# Patient Record
Sex: Male | Born: 1935 | Race: White | Hispanic: No | Marital: Married | Smoking: Former smoker
Health system: Southern US, Community
[De-identification: ages and names within clinical notes are randomized; demographics above are authoritative.]

## PROBLEM LIST (undated history)

## (undated) DIAGNOSIS — J111 Influenza due to unidentified influenza virus with other respiratory manifestations: Secondary | ICD-10-CM

## (undated) DIAGNOSIS — J61 Pneumoconiosis due to asbestos and other mineral fibers: Secondary | ICD-10-CM

## (undated) DIAGNOSIS — F0390 Unspecified dementia without behavioral disturbance: Secondary | ICD-10-CM

## (undated) HISTORY — DX: Pneumoconiosis due to asbestos and other mineral fibers: J61

## (undated) HISTORY — PX: HERNIA REPAIR: SHX51

## (undated) HISTORY — DX: Influenza due to unidentified influenza virus with other respiratory manifestations: J11.1

## (undated) HISTORY — PX: KIDNEY STONE SURGERY: SHX686

---

## 1997-07-24 DIAGNOSIS — J111 Influenza due to unidentified influenza virus with other respiratory manifestations: Secondary | ICD-10-CM

## 1997-07-24 HISTORY — DX: Influenza due to unidentified influenza virus with other respiratory manifestations: J11.1

## 1999-07-25 DIAGNOSIS — J61 Pneumoconiosis due to asbestos and other mineral fibers: Secondary | ICD-10-CM

## 1999-07-25 HISTORY — DX: Pneumoconiosis due to asbestos and other mineral fibers: J61

## 2006-01-02 ENCOUNTER — Ambulatory Visit (HOSPITAL_COMMUNITY): Admission: RE | Admit: 2006-01-02 | Discharge: 2006-01-02 | Payer: Self-pay | Admitting: Ophthalmology

## 2006-11-08 ENCOUNTER — Ambulatory Visit: Payer: Self-pay | Admitting: Internal Medicine

## 2012-03-21 DIAGNOSIS — J0100 Acute maxillary sinusitis, unspecified: Secondary | ICD-10-CM | POA: Diagnosis not present

## 2012-03-21 DIAGNOSIS — Z13220 Encounter for screening for lipoid disorders: Secondary | ICD-10-CM | POA: Diagnosis not present

## 2012-03-21 DIAGNOSIS — E559 Vitamin D deficiency, unspecified: Secondary | ICD-10-CM | POA: Diagnosis not present

## 2012-03-21 DIAGNOSIS — L570 Actinic keratosis: Secondary | ICD-10-CM | POA: Diagnosis not present

## 2012-12-24 DIAGNOSIS — J0100 Acute maxillary sinusitis, unspecified: Secondary | ICD-10-CM | POA: Diagnosis not present

## 2013-01-28 DIAGNOSIS — R5383 Other fatigue: Secondary | ICD-10-CM | POA: Diagnosis not present

## 2013-01-28 DIAGNOSIS — C44611 Basal cell carcinoma of skin of unspecified upper limb, including shoulder: Secondary | ICD-10-CM | POA: Diagnosis not present

## 2013-01-28 DIAGNOSIS — G3184 Mild cognitive impairment, so stated: Secondary | ICD-10-CM | POA: Diagnosis not present

## 2013-01-28 DIAGNOSIS — L570 Actinic keratosis: Secondary | ICD-10-CM | POA: Diagnosis not present

## 2013-01-28 DIAGNOSIS — L989 Disorder of the skin and subcutaneous tissue, unspecified: Secondary | ICD-10-CM | POA: Diagnosis not present

## 2013-01-28 DIAGNOSIS — J309 Allergic rhinitis, unspecified: Secondary | ICD-10-CM | POA: Diagnosis not present

## 2014-06-20 DIAGNOSIS — Z882 Allergy status to sulfonamides status: Secondary | ICD-10-CM | POA: Diagnosis not present

## 2014-06-20 DIAGNOSIS — N390 Urinary tract infection, site not specified: Secondary | ICD-10-CM | POA: Diagnosis not present

## 2014-06-20 DIAGNOSIS — Z87891 Personal history of nicotine dependence: Secondary | ICD-10-CM | POA: Diagnosis not present

## 2014-06-20 DIAGNOSIS — Z880 Allergy status to penicillin: Secondary | ICD-10-CM | POA: Diagnosis not present

## 2014-06-20 DIAGNOSIS — J302 Other seasonal allergic rhinitis: Secondary | ICD-10-CM | POA: Diagnosis not present

## 2014-06-20 DIAGNOSIS — R300 Dysuria: Secondary | ICD-10-CM | POA: Diagnosis not present

## 2014-06-25 DIAGNOSIS — J0100 Acute maxillary sinusitis, unspecified: Secondary | ICD-10-CM | POA: Diagnosis not present

## 2014-06-25 DIAGNOSIS — R32 Unspecified urinary incontinence: Secondary | ICD-10-CM | POA: Diagnosis not present

## 2014-06-25 DIAGNOSIS — N400 Enlarged prostate without lower urinary tract symptoms: Secondary | ICD-10-CM | POA: Diagnosis not present

## 2014-10-26 DIAGNOSIS — Z961 Presence of intraocular lens: Secondary | ICD-10-CM | POA: Diagnosis not present

## 2014-10-26 DIAGNOSIS — H26491 Other secondary cataract, right eye: Secondary | ICD-10-CM | POA: Diagnosis not present

## 2014-10-26 DIAGNOSIS — H35363 Drusen (degenerative) of macula, bilateral: Secondary | ICD-10-CM | POA: Diagnosis not present

## 2015-03-01 DIAGNOSIS — L03211 Cellulitis of face: Secondary | ICD-10-CM | POA: Diagnosis not present

## 2015-03-01 DIAGNOSIS — R5382 Chronic fatigue, unspecified: Secondary | ICD-10-CM | POA: Diagnosis not present

## 2015-03-01 DIAGNOSIS — E559 Vitamin D deficiency, unspecified: Secondary | ICD-10-CM | POA: Diagnosis not present

## 2015-05-31 DIAGNOSIS — K5641 Fecal impaction: Secondary | ICD-10-CM | POA: Diagnosis not present

## 2015-05-31 DIAGNOSIS — K5900 Constipation, unspecified: Secondary | ICD-10-CM | POA: Diagnosis not present

## 2015-05-31 DIAGNOSIS — Z87891 Personal history of nicotine dependence: Secondary | ICD-10-CM | POA: Diagnosis not present

## 2015-05-31 DIAGNOSIS — K5649 Other impaction of intestine: Secondary | ICD-10-CM | POA: Diagnosis not present

## 2015-07-20 DIAGNOSIS — I10 Essential (primary) hypertension: Secondary | ICD-10-CM | POA: Diagnosis not present

## 2015-07-20 DIAGNOSIS — J0101 Acute recurrent maxillary sinusitis: Secondary | ICD-10-CM | POA: Diagnosis not present

## 2015-11-18 DIAGNOSIS — R5383 Other fatigue: Secondary | ICD-10-CM | POA: Diagnosis not present

## 2015-11-18 DIAGNOSIS — Z0000 Encounter for general adult medical examination without abnormal findings: Secondary | ICD-10-CM | POA: Diagnosis not present

## 2015-11-18 DIAGNOSIS — L570 Actinic keratosis: Secondary | ICD-10-CM | POA: Diagnosis not present

## 2015-11-18 DIAGNOSIS — N400 Enlarged prostate without lower urinary tract symptoms: Secondary | ICD-10-CM | POA: Diagnosis not present

## 2015-11-18 DIAGNOSIS — F0280 Dementia in other diseases classified elsewhere without behavioral disturbance: Secondary | ICD-10-CM | POA: Diagnosis not present

## 2016-06-16 DIAGNOSIS — Z809 Family history of malignant neoplasm, unspecified: Secondary | ICD-10-CM | POA: Diagnosis not present

## 2016-06-16 DIAGNOSIS — Z8249 Family history of ischemic heart disease and other diseases of the circulatory system: Secondary | ICD-10-CM | POA: Diagnosis not present

## 2016-06-16 DIAGNOSIS — I5089 Other heart failure: Secondary | ICD-10-CM | POA: Diagnosis not present

## 2016-06-16 DIAGNOSIS — J9601 Acute respiratory failure with hypoxia: Secondary | ICD-10-CM | POA: Diagnosis not present

## 2016-06-16 DIAGNOSIS — E860 Dehydration: Secondary | ICD-10-CM | POA: Diagnosis not present

## 2016-06-16 DIAGNOSIS — F0390 Unspecified dementia without behavioral disturbance: Secondary | ICD-10-CM | POA: Diagnosis present

## 2016-06-16 DIAGNOSIS — Z886 Allergy status to analgesic agent status: Secondary | ICD-10-CM | POA: Diagnosis not present

## 2016-06-16 DIAGNOSIS — Z880 Allergy status to penicillin: Secondary | ICD-10-CM | POA: Diagnosis not present

## 2016-06-16 DIAGNOSIS — J181 Lobar pneumonia, unspecified organism: Secondary | ICD-10-CM | POA: Diagnosis not present

## 2016-06-16 DIAGNOSIS — R0989 Other specified symptoms and signs involving the circulatory and respiratory systems: Secondary | ICD-10-CM | POA: Diagnosis not present

## 2016-06-16 DIAGNOSIS — I5030 Unspecified diastolic (congestive) heart failure: Secondary | ICD-10-CM | POA: Diagnosis present

## 2016-06-16 DIAGNOSIS — R0902 Hypoxemia: Secondary | ICD-10-CM | POA: Diagnosis not present

## 2016-06-16 DIAGNOSIS — Z87891 Personal history of nicotine dependence: Secondary | ICD-10-CM | POA: Diagnosis not present

## 2016-06-16 DIAGNOSIS — J61 Pneumoconiosis due to asbestos and other mineral fibers: Secondary | ICD-10-CM | POA: Diagnosis not present

## 2016-06-16 DIAGNOSIS — Z806 Family history of leukemia: Secondary | ICD-10-CM | POA: Diagnosis not present

## 2016-06-16 DIAGNOSIS — J168 Pneumonia due to other specified infectious organisms: Secondary | ICD-10-CM | POA: Diagnosis not present

## 2016-06-16 DIAGNOSIS — J189 Pneumonia, unspecified organism: Secondary | ICD-10-CM | POA: Diagnosis not present

## 2016-06-20 DIAGNOSIS — J181 Lobar pneumonia, unspecified organism: Secondary | ICD-10-CM | POA: Diagnosis not present

## 2016-06-20 DIAGNOSIS — J61 Pneumoconiosis due to asbestos and other mineral fibers: Secondary | ICD-10-CM | POA: Diagnosis not present

## 2016-06-20 DIAGNOSIS — I5089 Other heart failure: Secondary | ICD-10-CM | POA: Diagnosis not present

## 2016-06-20 DIAGNOSIS — Z87891 Personal history of nicotine dependence: Secondary | ICD-10-CM | POA: Diagnosis not present

## 2016-06-20 DIAGNOSIS — J17 Pneumonia in diseases classified elsewhere: Secondary | ICD-10-CM | POA: Diagnosis not present

## 2016-06-20 DIAGNOSIS — I509 Heart failure, unspecified: Secondary | ICD-10-CM | POA: Diagnosis not present

## 2016-06-20 DIAGNOSIS — E860 Dehydration: Secondary | ICD-10-CM | POA: Diagnosis not present

## 2016-06-21 DIAGNOSIS — J61 Pneumoconiosis due to asbestos and other mineral fibers: Secondary | ICD-10-CM | POA: Diagnosis not present

## 2016-06-21 DIAGNOSIS — J181 Lobar pneumonia, unspecified organism: Secondary | ICD-10-CM | POA: Diagnosis not present

## 2016-06-21 DIAGNOSIS — I509 Heart failure, unspecified: Secondary | ICD-10-CM | POA: Diagnosis not present

## 2016-06-21 DIAGNOSIS — Z87891 Personal history of nicotine dependence: Secondary | ICD-10-CM | POA: Diagnosis not present

## 2016-06-21 DIAGNOSIS — E860 Dehydration: Secondary | ICD-10-CM | POA: Diagnosis not present

## 2016-06-22 DIAGNOSIS — Z87891 Personal history of nicotine dependence: Secondary | ICD-10-CM | POA: Diagnosis not present

## 2016-06-22 DIAGNOSIS — I509 Heart failure, unspecified: Secondary | ICD-10-CM | POA: Diagnosis not present

## 2016-06-22 DIAGNOSIS — J61 Pneumoconiosis due to asbestos and other mineral fibers: Secondary | ICD-10-CM | POA: Diagnosis not present

## 2016-06-22 DIAGNOSIS — E860 Dehydration: Secondary | ICD-10-CM | POA: Diagnosis not present

## 2016-06-22 DIAGNOSIS — J181 Lobar pneumonia, unspecified organism: Secondary | ICD-10-CM | POA: Diagnosis not present

## 2016-06-26 DIAGNOSIS — J168 Pneumonia due to other specified infectious organisms: Secondary | ICD-10-CM | POA: Diagnosis not present

## 2016-06-27 DIAGNOSIS — Z87891 Personal history of nicotine dependence: Secondary | ICD-10-CM | POA: Diagnosis not present

## 2016-06-27 DIAGNOSIS — I509 Heart failure, unspecified: Secondary | ICD-10-CM | POA: Diagnosis not present

## 2016-06-27 DIAGNOSIS — E860 Dehydration: Secondary | ICD-10-CM | POA: Diagnosis not present

## 2016-06-27 DIAGNOSIS — J61 Pneumoconiosis due to asbestos and other mineral fibers: Secondary | ICD-10-CM | POA: Diagnosis not present

## 2016-06-27 DIAGNOSIS — J181 Lobar pneumonia, unspecified organism: Secondary | ICD-10-CM | POA: Diagnosis not present

## 2016-06-29 DIAGNOSIS — J181 Lobar pneumonia, unspecified organism: Secondary | ICD-10-CM | POA: Diagnosis not present

## 2016-06-29 DIAGNOSIS — I509 Heart failure, unspecified: Secondary | ICD-10-CM | POA: Diagnosis not present

## 2016-06-29 DIAGNOSIS — E860 Dehydration: Secondary | ICD-10-CM | POA: Diagnosis not present

## 2016-06-29 DIAGNOSIS — Z87891 Personal history of nicotine dependence: Secondary | ICD-10-CM | POA: Diagnosis not present

## 2016-06-29 DIAGNOSIS — J61 Pneumoconiosis due to asbestos and other mineral fibers: Secondary | ICD-10-CM | POA: Diagnosis not present

## 2016-07-03 DIAGNOSIS — I509 Heart failure, unspecified: Secondary | ICD-10-CM | POA: Diagnosis not present

## 2016-07-03 DIAGNOSIS — J61 Pneumoconiosis due to asbestos and other mineral fibers: Secondary | ICD-10-CM | POA: Diagnosis not present

## 2016-07-03 DIAGNOSIS — Z87891 Personal history of nicotine dependence: Secondary | ICD-10-CM | POA: Diagnosis not present

## 2016-07-03 DIAGNOSIS — E860 Dehydration: Secondary | ICD-10-CM | POA: Diagnosis not present

## 2016-07-03 DIAGNOSIS — J181 Lobar pneumonia, unspecified organism: Secondary | ICD-10-CM | POA: Diagnosis not present

## 2016-07-04 DIAGNOSIS — I509 Heart failure, unspecified: Secondary | ICD-10-CM | POA: Diagnosis not present

## 2016-07-04 DIAGNOSIS — Z87891 Personal history of nicotine dependence: Secondary | ICD-10-CM | POA: Diagnosis not present

## 2016-07-04 DIAGNOSIS — E860 Dehydration: Secondary | ICD-10-CM | POA: Diagnosis not present

## 2016-07-04 DIAGNOSIS — J61 Pneumoconiosis due to asbestos and other mineral fibers: Secondary | ICD-10-CM | POA: Diagnosis not present

## 2016-07-04 DIAGNOSIS — J181 Lobar pneumonia, unspecified organism: Secondary | ICD-10-CM | POA: Diagnosis not present

## 2016-07-05 DIAGNOSIS — E860 Dehydration: Secondary | ICD-10-CM | POA: Diagnosis not present

## 2016-07-05 DIAGNOSIS — J181 Lobar pneumonia, unspecified organism: Secondary | ICD-10-CM | POA: Diagnosis not present

## 2016-07-05 DIAGNOSIS — J61 Pneumoconiosis due to asbestos and other mineral fibers: Secondary | ICD-10-CM | POA: Diagnosis not present

## 2016-07-05 DIAGNOSIS — I509 Heart failure, unspecified: Secondary | ICD-10-CM | POA: Diagnosis not present

## 2016-07-05 DIAGNOSIS — Z87891 Personal history of nicotine dependence: Secondary | ICD-10-CM | POA: Diagnosis not present

## 2016-07-07 DIAGNOSIS — J61 Pneumoconiosis due to asbestos and other mineral fibers: Secondary | ICD-10-CM | POA: Diagnosis not present

## 2016-07-07 DIAGNOSIS — J181 Lobar pneumonia, unspecified organism: Secondary | ICD-10-CM | POA: Diagnosis not present

## 2016-07-07 DIAGNOSIS — E860 Dehydration: Secondary | ICD-10-CM | POA: Diagnosis not present

## 2016-07-07 DIAGNOSIS — I509 Heart failure, unspecified: Secondary | ICD-10-CM | POA: Diagnosis not present

## 2016-07-07 DIAGNOSIS — Z87891 Personal history of nicotine dependence: Secondary | ICD-10-CM | POA: Diagnosis not present

## 2016-07-10 DIAGNOSIS — J61 Pneumoconiosis due to asbestos and other mineral fibers: Secondary | ICD-10-CM | POA: Diagnosis not present

## 2016-07-10 DIAGNOSIS — I509 Heart failure, unspecified: Secondary | ICD-10-CM | POA: Diagnosis not present

## 2016-07-10 DIAGNOSIS — E860 Dehydration: Secondary | ICD-10-CM | POA: Diagnosis not present

## 2016-07-10 DIAGNOSIS — J181 Lobar pneumonia, unspecified organism: Secondary | ICD-10-CM | POA: Diagnosis not present

## 2016-07-10 DIAGNOSIS — Z87891 Personal history of nicotine dependence: Secondary | ICD-10-CM | POA: Diagnosis not present

## 2016-07-13 DIAGNOSIS — Z87891 Personal history of nicotine dependence: Secondary | ICD-10-CM | POA: Diagnosis not present

## 2016-07-13 DIAGNOSIS — I509 Heart failure, unspecified: Secondary | ICD-10-CM | POA: Diagnosis not present

## 2016-07-13 DIAGNOSIS — J181 Lobar pneumonia, unspecified organism: Secondary | ICD-10-CM | POA: Diagnosis not present

## 2016-07-13 DIAGNOSIS — J61 Pneumoconiosis due to asbestos and other mineral fibers: Secondary | ICD-10-CM | POA: Diagnosis not present

## 2016-07-13 DIAGNOSIS — E860 Dehydration: Secondary | ICD-10-CM | POA: Diagnosis not present

## 2016-10-26 DIAGNOSIS — C44529 Squamous cell carcinoma of skin of other part of trunk: Secondary | ICD-10-CM | POA: Diagnosis not present

## 2016-10-26 DIAGNOSIS — C4442 Squamous cell carcinoma of skin of scalp and neck: Secondary | ICD-10-CM | POA: Diagnosis not present

## 2017-01-26 DIAGNOSIS — J3089 Other allergic rhinitis: Secondary | ICD-10-CM | POA: Diagnosis not present

## 2017-01-26 DIAGNOSIS — R630 Anorexia: Secondary | ICD-10-CM | POA: Diagnosis not present

## 2017-05-29 DIAGNOSIS — F0390 Unspecified dementia without behavioral disturbance: Secondary | ICD-10-CM | POA: Diagnosis present

## 2017-05-29 DIAGNOSIS — Z818 Family history of other mental and behavioral disorders: Secondary | ICD-10-CM | POA: Diagnosis not present

## 2017-05-29 DIAGNOSIS — W1839XA Other fall on same level, initial encounter: Secondary | ICD-10-CM | POA: Diagnosis not present

## 2017-05-29 DIAGNOSIS — M25562 Pain in left knee: Secondary | ICD-10-CM | POA: Diagnosis not present

## 2017-05-29 DIAGNOSIS — Z9181 History of falling: Secondary | ICD-10-CM | POA: Diagnosis not present

## 2017-05-29 DIAGNOSIS — M79605 Pain in left leg: Secondary | ICD-10-CM | POA: Diagnosis not present

## 2017-05-29 DIAGNOSIS — M79652 Pain in left thigh: Secondary | ICD-10-CM | POA: Diagnosis not present

## 2017-05-29 DIAGNOSIS — W182XXA Fall in (into) shower or empty bathtub, initial encounter: Secondary | ICD-10-CM | POA: Diagnosis not present

## 2017-05-29 DIAGNOSIS — M25552 Pain in left hip: Secondary | ICD-10-CM | POA: Diagnosis not present

## 2017-05-29 DIAGNOSIS — Z8249 Family history of ischemic heart disease and other diseases of the circulatory system: Secondary | ICD-10-CM | POA: Diagnosis not present

## 2017-05-29 DIAGNOSIS — S838X2A Sprain of other specified parts of left knee, initial encounter: Secondary | ICD-10-CM | POA: Diagnosis present

## 2017-05-29 DIAGNOSIS — Z884 Allergy status to anesthetic agent status: Secondary | ICD-10-CM | POA: Diagnosis not present

## 2017-05-29 DIAGNOSIS — Y93E1 Activity, personal bathing and showering: Secondary | ICD-10-CM | POA: Diagnosis not present

## 2017-05-29 DIAGNOSIS — Z880 Allergy status to penicillin: Secondary | ICD-10-CM | POA: Diagnosis not present

## 2017-05-29 DIAGNOSIS — R262 Difficulty in walking, not elsewhere classified: Secondary | ICD-10-CM | POA: Diagnosis not present

## 2017-05-29 DIAGNOSIS — Z806 Family history of leukemia: Secondary | ICD-10-CM | POA: Diagnosis not present

## 2017-05-29 DIAGNOSIS — Z809 Family history of malignant neoplasm, unspecified: Secondary | ICD-10-CM | POA: Diagnosis not present

## 2017-05-29 DIAGNOSIS — S72402A Unspecified fracture of lower end of left femur, initial encounter for closed fracture: Secondary | ICD-10-CM | POA: Diagnosis not present

## 2017-05-29 DIAGNOSIS — Z886 Allergy status to analgesic agent status: Secondary | ICD-10-CM | POA: Diagnosis not present

## 2017-05-29 DIAGNOSIS — Z888 Allergy status to other drugs, medicaments and biological substances status: Secondary | ICD-10-CM | POA: Diagnosis not present

## 2017-05-31 DIAGNOSIS — F0390 Unspecified dementia without behavioral disturbance: Secondary | ICD-10-CM | POA: Diagnosis not present

## 2017-05-31 DIAGNOSIS — Z9181 History of falling: Secondary | ICD-10-CM | POA: Diagnosis not present

## 2017-05-31 DIAGNOSIS — F0280 Dementia in other diseases classified elsewhere without behavioral disturbance: Secondary | ICD-10-CM | POA: Diagnosis not present

## 2017-05-31 DIAGNOSIS — S838X2D Sprain of other specified parts of left knee, subsequent encounter: Secondary | ICD-10-CM | POA: Diagnosis not present

## 2017-05-31 DIAGNOSIS — S8392XD Sprain of unspecified site of left knee, subsequent encounter: Secondary | ICD-10-CM | POA: Diagnosis not present

## 2017-06-01 DIAGNOSIS — M79605 Pain in left leg: Secondary | ICD-10-CM | POA: Diagnosis not present

## 2017-06-05 DIAGNOSIS — Z9181 History of falling: Secondary | ICD-10-CM | POA: Diagnosis not present

## 2017-06-05 DIAGNOSIS — F0390 Unspecified dementia without behavioral disturbance: Secondary | ICD-10-CM | POA: Diagnosis not present

## 2017-06-05 DIAGNOSIS — S8392XD Sprain of unspecified site of left knee, subsequent encounter: Secondary | ICD-10-CM | POA: Diagnosis not present

## 2017-06-06 DIAGNOSIS — Z9181 History of falling: Secondary | ICD-10-CM | POA: Diagnosis not present

## 2017-06-06 DIAGNOSIS — F0390 Unspecified dementia without behavioral disturbance: Secondary | ICD-10-CM | POA: Diagnosis not present

## 2017-06-06 DIAGNOSIS — S8392XD Sprain of unspecified site of left knee, subsequent encounter: Secondary | ICD-10-CM | POA: Diagnosis not present

## 2017-06-07 DIAGNOSIS — Z9181 History of falling: Secondary | ICD-10-CM | POA: Diagnosis not present

## 2017-06-07 DIAGNOSIS — S8392XD Sprain of unspecified site of left knee, subsequent encounter: Secondary | ICD-10-CM | POA: Diagnosis not present

## 2017-06-07 DIAGNOSIS — F0390 Unspecified dementia without behavioral disturbance: Secondary | ICD-10-CM | POA: Diagnosis not present

## 2017-06-08 DIAGNOSIS — S8392XD Sprain of unspecified site of left knee, subsequent encounter: Secondary | ICD-10-CM | POA: Diagnosis not present

## 2017-06-08 DIAGNOSIS — J302 Other seasonal allergic rhinitis: Secondary | ICD-10-CM | POA: Diagnosis not present

## 2017-06-08 DIAGNOSIS — Z9181 History of falling: Secondary | ICD-10-CM | POA: Diagnosis not present

## 2017-06-08 DIAGNOSIS — M25562 Pain in left knee: Secondary | ICD-10-CM | POA: Diagnosis not present

## 2017-06-08 DIAGNOSIS — M79652 Pain in left thigh: Secondary | ICD-10-CM | POA: Diagnosis not present

## 2017-06-08 DIAGNOSIS — F0280 Dementia in other diseases classified elsewhere without behavioral disturbance: Secondary | ICD-10-CM | POA: Diagnosis not present

## 2017-06-08 DIAGNOSIS — F0390 Unspecified dementia without behavioral disturbance: Secondary | ICD-10-CM | POA: Diagnosis not present

## 2017-06-11 DIAGNOSIS — S8392XD Sprain of unspecified site of left knee, subsequent encounter: Secondary | ICD-10-CM | POA: Diagnosis not present

## 2017-06-11 DIAGNOSIS — F0390 Unspecified dementia without behavioral disturbance: Secondary | ICD-10-CM | POA: Diagnosis not present

## 2017-06-11 DIAGNOSIS — Z9181 History of falling: Secondary | ICD-10-CM | POA: Diagnosis not present

## 2017-06-12 DIAGNOSIS — F0390 Unspecified dementia without behavioral disturbance: Secondary | ICD-10-CM | POA: Diagnosis not present

## 2017-06-12 DIAGNOSIS — S8392XD Sprain of unspecified site of left knee, subsequent encounter: Secondary | ICD-10-CM | POA: Diagnosis not present

## 2017-06-12 DIAGNOSIS — Z9181 History of falling: Secondary | ICD-10-CM | POA: Diagnosis not present

## 2017-06-19 DIAGNOSIS — Z9181 History of falling: Secondary | ICD-10-CM | POA: Diagnosis not present

## 2017-06-19 DIAGNOSIS — S8392XD Sprain of unspecified site of left knee, subsequent encounter: Secondary | ICD-10-CM | POA: Diagnosis not present

## 2017-06-19 DIAGNOSIS — F0390 Unspecified dementia without behavioral disturbance: Secondary | ICD-10-CM | POA: Diagnosis not present

## 2017-06-20 DIAGNOSIS — F0390 Unspecified dementia without behavioral disturbance: Secondary | ICD-10-CM | POA: Diagnosis not present

## 2017-06-20 DIAGNOSIS — S8392XD Sprain of unspecified site of left knee, subsequent encounter: Secondary | ICD-10-CM | POA: Diagnosis not present

## 2017-06-20 DIAGNOSIS — Z9181 History of falling: Secondary | ICD-10-CM | POA: Diagnosis not present

## 2017-06-21 DIAGNOSIS — Z9181 History of falling: Secondary | ICD-10-CM | POA: Diagnosis not present

## 2017-06-21 DIAGNOSIS — F0390 Unspecified dementia without behavioral disturbance: Secondary | ICD-10-CM | POA: Diagnosis not present

## 2017-06-21 DIAGNOSIS — S8392XD Sprain of unspecified site of left knee, subsequent encounter: Secondary | ICD-10-CM | POA: Diagnosis not present

## 2017-06-25 DIAGNOSIS — S8392XD Sprain of unspecified site of left knee, subsequent encounter: Secondary | ICD-10-CM | POA: Diagnosis not present

## 2017-06-25 DIAGNOSIS — F0390 Unspecified dementia without behavioral disturbance: Secondary | ICD-10-CM | POA: Diagnosis not present

## 2017-06-25 DIAGNOSIS — Z9181 History of falling: Secondary | ICD-10-CM | POA: Diagnosis not present

## 2017-06-29 DIAGNOSIS — Z9181 History of falling: Secondary | ICD-10-CM | POA: Diagnosis not present

## 2017-06-29 DIAGNOSIS — K5900 Constipation, unspecified: Secondary | ICD-10-CM | POA: Diagnosis not present

## 2017-06-29 DIAGNOSIS — R1084 Generalized abdominal pain: Secondary | ICD-10-CM | POA: Diagnosis not present

## 2017-06-29 DIAGNOSIS — N200 Calculus of kidney: Secondary | ICD-10-CM | POA: Diagnosis not present

## 2017-06-29 DIAGNOSIS — S8392XD Sprain of unspecified site of left knee, subsequent encounter: Secondary | ICD-10-CM | POA: Diagnosis not present

## 2017-06-29 DIAGNOSIS — F0390 Unspecified dementia without behavioral disturbance: Secondary | ICD-10-CM | POA: Diagnosis not present

## 2017-07-04 DIAGNOSIS — Z9181 History of falling: Secondary | ICD-10-CM | POA: Diagnosis not present

## 2017-07-04 DIAGNOSIS — S8392XD Sprain of unspecified site of left knee, subsequent encounter: Secondary | ICD-10-CM | POA: Diagnosis not present

## 2017-07-04 DIAGNOSIS — F0390 Unspecified dementia without behavioral disturbance: Secondary | ICD-10-CM | POA: Diagnosis not present

## 2017-07-05 DIAGNOSIS — S8392XD Sprain of unspecified site of left knee, subsequent encounter: Secondary | ICD-10-CM | POA: Diagnosis not present

## 2017-07-05 DIAGNOSIS — F0390 Unspecified dementia without behavioral disturbance: Secondary | ICD-10-CM | POA: Diagnosis not present

## 2017-07-05 DIAGNOSIS — Z9181 History of falling: Secondary | ICD-10-CM | POA: Diagnosis not present

## 2017-07-10 DIAGNOSIS — S8392XD Sprain of unspecified site of left knee, subsequent encounter: Secondary | ICD-10-CM | POA: Diagnosis not present

## 2017-07-10 DIAGNOSIS — F0390 Unspecified dementia without behavioral disturbance: Secondary | ICD-10-CM | POA: Diagnosis not present

## 2017-07-10 DIAGNOSIS — Z9181 History of falling: Secondary | ICD-10-CM | POA: Diagnosis not present

## 2017-07-12 DIAGNOSIS — Z9181 History of falling: Secondary | ICD-10-CM | POA: Diagnosis not present

## 2017-07-12 DIAGNOSIS — S8392XD Sprain of unspecified site of left knee, subsequent encounter: Secondary | ICD-10-CM | POA: Diagnosis not present

## 2017-07-12 DIAGNOSIS — F0390 Unspecified dementia without behavioral disturbance: Secondary | ICD-10-CM | POA: Diagnosis not present

## 2017-07-16 DIAGNOSIS — Z9181 History of falling: Secondary | ICD-10-CM | POA: Diagnosis not present

## 2017-07-16 DIAGNOSIS — F0390 Unspecified dementia without behavioral disturbance: Secondary | ICD-10-CM | POA: Diagnosis not present

## 2017-07-16 DIAGNOSIS — S8392XD Sprain of unspecified site of left knee, subsequent encounter: Secondary | ICD-10-CM | POA: Diagnosis not present

## 2017-07-18 DIAGNOSIS — F0390 Unspecified dementia without behavioral disturbance: Secondary | ICD-10-CM | POA: Diagnosis not present

## 2017-07-18 DIAGNOSIS — Z9181 History of falling: Secondary | ICD-10-CM | POA: Diagnosis not present

## 2017-07-18 DIAGNOSIS — S8392XD Sprain of unspecified site of left knee, subsequent encounter: Secondary | ICD-10-CM | POA: Diagnosis not present

## 2017-07-19 DIAGNOSIS — F0390 Unspecified dementia without behavioral disturbance: Secondary | ICD-10-CM | POA: Diagnosis not present

## 2017-07-19 DIAGNOSIS — Z9181 History of falling: Secondary | ICD-10-CM | POA: Diagnosis not present

## 2017-07-19 DIAGNOSIS — S8392XD Sprain of unspecified site of left knee, subsequent encounter: Secondary | ICD-10-CM | POA: Diagnosis not present

## 2017-07-24 DIAGNOSIS — S8392XD Sprain of unspecified site of left knee, subsequent encounter: Secondary | ICD-10-CM | POA: Diagnosis not present

## 2017-07-24 DIAGNOSIS — Z9181 History of falling: Secondary | ICD-10-CM | POA: Diagnosis not present

## 2017-07-24 DIAGNOSIS — F0390 Unspecified dementia without behavioral disturbance: Secondary | ICD-10-CM | POA: Diagnosis not present

## 2017-12-20 DIAGNOSIS — Z0000 Encounter for general adult medical examination without abnormal findings: Secondary | ICD-10-CM | POA: Diagnosis not present

## 2017-12-20 DIAGNOSIS — F0280 Dementia in other diseases classified elsewhere without behavioral disturbance: Secondary | ICD-10-CM | POA: Diagnosis not present

## 2017-12-20 DIAGNOSIS — K210 Gastro-esophageal reflux disease with esophagitis: Secondary | ICD-10-CM | POA: Diagnosis not present

## 2017-12-20 DIAGNOSIS — J301 Allergic rhinitis due to pollen: Secondary | ICD-10-CM | POA: Diagnosis not present

## 2017-12-20 DIAGNOSIS — Z1389 Encounter for screening for other disorder: Secondary | ICD-10-CM | POA: Diagnosis not present

## 2018-05-29 DIAGNOSIS — K210 Gastro-esophageal reflux disease with esophagitis: Secondary | ICD-10-CM | POA: Diagnosis not present

## 2018-05-29 DIAGNOSIS — J301 Allergic rhinitis due to pollen: Secondary | ICD-10-CM | POA: Diagnosis not present

## 2018-05-29 DIAGNOSIS — F0280 Dementia in other diseases classified elsewhere without behavioral disturbance: Secondary | ICD-10-CM | POA: Diagnosis not present

## 2018-06-03 DIAGNOSIS — F0391 Unspecified dementia with behavioral disturbance: Secondary | ICD-10-CM | POA: Diagnosis not present

## 2018-06-03 DIAGNOSIS — Z87891 Personal history of nicotine dependence: Secondary | ICD-10-CM | POA: Diagnosis not present

## 2018-06-03 DIAGNOSIS — R2689 Other abnormalities of gait and mobility: Secondary | ICD-10-CM | POA: Diagnosis not present

## 2018-06-03 DIAGNOSIS — R41841 Cognitive communication deficit: Secondary | ICD-10-CM | POA: Diagnosis not present

## 2018-06-03 DIAGNOSIS — Z9181 History of falling: Secondary | ICD-10-CM | POA: Diagnosis not present

## 2018-06-05 DIAGNOSIS — R41841 Cognitive communication deficit: Secondary | ICD-10-CM | POA: Diagnosis not present

## 2018-06-05 DIAGNOSIS — F0391 Unspecified dementia with behavioral disturbance: Secondary | ICD-10-CM | POA: Diagnosis not present

## 2018-06-05 DIAGNOSIS — R2689 Other abnormalities of gait and mobility: Secondary | ICD-10-CM | POA: Diagnosis not present

## 2018-06-05 DIAGNOSIS — Z9181 History of falling: Secondary | ICD-10-CM | POA: Diagnosis not present

## 2018-06-05 DIAGNOSIS — Z87891 Personal history of nicotine dependence: Secondary | ICD-10-CM | POA: Diagnosis not present

## 2018-06-06 DIAGNOSIS — Z9181 History of falling: Secondary | ICD-10-CM | POA: Diagnosis not present

## 2018-06-06 DIAGNOSIS — R41841 Cognitive communication deficit: Secondary | ICD-10-CM | POA: Diagnosis not present

## 2018-06-06 DIAGNOSIS — Z87891 Personal history of nicotine dependence: Secondary | ICD-10-CM | POA: Diagnosis not present

## 2018-06-06 DIAGNOSIS — R2689 Other abnormalities of gait and mobility: Secondary | ICD-10-CM | POA: Diagnosis not present

## 2018-06-06 DIAGNOSIS — F0391 Unspecified dementia with behavioral disturbance: Secondary | ICD-10-CM | POA: Diagnosis not present

## 2018-06-10 DIAGNOSIS — R41841 Cognitive communication deficit: Secondary | ICD-10-CM | POA: Diagnosis not present

## 2018-06-10 DIAGNOSIS — F0391 Unspecified dementia with behavioral disturbance: Secondary | ICD-10-CM | POA: Diagnosis not present

## 2018-06-10 DIAGNOSIS — Z87891 Personal history of nicotine dependence: Secondary | ICD-10-CM | POA: Diagnosis not present

## 2018-06-10 DIAGNOSIS — Z9181 History of falling: Secondary | ICD-10-CM | POA: Diagnosis not present

## 2018-06-10 DIAGNOSIS — R2689 Other abnormalities of gait and mobility: Secondary | ICD-10-CM | POA: Diagnosis not present

## 2018-06-12 DIAGNOSIS — R2689 Other abnormalities of gait and mobility: Secondary | ICD-10-CM | POA: Diagnosis not present

## 2018-06-12 DIAGNOSIS — F0391 Unspecified dementia with behavioral disturbance: Secondary | ICD-10-CM | POA: Diagnosis not present

## 2018-06-12 DIAGNOSIS — R41841 Cognitive communication deficit: Secondary | ICD-10-CM | POA: Diagnosis not present

## 2018-06-12 DIAGNOSIS — Z9181 History of falling: Secondary | ICD-10-CM | POA: Diagnosis not present

## 2018-06-12 DIAGNOSIS — Z87891 Personal history of nicotine dependence: Secondary | ICD-10-CM | POA: Diagnosis not present

## 2018-06-13 DIAGNOSIS — F0391 Unspecified dementia with behavioral disturbance: Secondary | ICD-10-CM | POA: Diagnosis not present

## 2018-06-13 DIAGNOSIS — R41841 Cognitive communication deficit: Secondary | ICD-10-CM | POA: Diagnosis not present

## 2018-06-13 DIAGNOSIS — Z87891 Personal history of nicotine dependence: Secondary | ICD-10-CM | POA: Diagnosis not present

## 2018-06-13 DIAGNOSIS — Z9181 History of falling: Secondary | ICD-10-CM | POA: Diagnosis not present

## 2018-06-13 DIAGNOSIS — R2689 Other abnormalities of gait and mobility: Secondary | ICD-10-CM | POA: Diagnosis not present

## 2018-06-14 DIAGNOSIS — R2689 Other abnormalities of gait and mobility: Secondary | ICD-10-CM | POA: Diagnosis not present

## 2018-06-14 DIAGNOSIS — Z9181 History of falling: Secondary | ICD-10-CM | POA: Diagnosis not present

## 2018-06-14 DIAGNOSIS — F0391 Unspecified dementia with behavioral disturbance: Secondary | ICD-10-CM | POA: Diagnosis not present

## 2018-06-14 DIAGNOSIS — Z87891 Personal history of nicotine dependence: Secondary | ICD-10-CM | POA: Diagnosis not present

## 2018-06-14 DIAGNOSIS — R41841 Cognitive communication deficit: Secondary | ICD-10-CM | POA: Diagnosis not present

## 2018-06-17 DIAGNOSIS — Z87891 Personal history of nicotine dependence: Secondary | ICD-10-CM | POA: Diagnosis not present

## 2018-06-17 DIAGNOSIS — R2689 Other abnormalities of gait and mobility: Secondary | ICD-10-CM | POA: Diagnosis not present

## 2018-06-17 DIAGNOSIS — Z9181 History of falling: Secondary | ICD-10-CM | POA: Diagnosis not present

## 2018-06-17 DIAGNOSIS — R41841 Cognitive communication deficit: Secondary | ICD-10-CM | POA: Diagnosis not present

## 2018-06-17 DIAGNOSIS — F0391 Unspecified dementia with behavioral disturbance: Secondary | ICD-10-CM | POA: Diagnosis not present

## 2018-06-18 DIAGNOSIS — R41841 Cognitive communication deficit: Secondary | ICD-10-CM | POA: Diagnosis not present

## 2018-06-18 DIAGNOSIS — Z9181 History of falling: Secondary | ICD-10-CM | POA: Diagnosis not present

## 2018-06-18 DIAGNOSIS — Z87891 Personal history of nicotine dependence: Secondary | ICD-10-CM | POA: Diagnosis not present

## 2018-06-18 DIAGNOSIS — R2689 Other abnormalities of gait and mobility: Secondary | ICD-10-CM | POA: Diagnosis not present

## 2018-06-18 DIAGNOSIS — F0391 Unspecified dementia with behavioral disturbance: Secondary | ICD-10-CM | POA: Diagnosis not present

## 2018-06-19 DIAGNOSIS — R2689 Other abnormalities of gait and mobility: Secondary | ICD-10-CM | POA: Diagnosis not present

## 2018-06-19 DIAGNOSIS — Z87891 Personal history of nicotine dependence: Secondary | ICD-10-CM | POA: Diagnosis not present

## 2018-06-19 DIAGNOSIS — R41841 Cognitive communication deficit: Secondary | ICD-10-CM | POA: Diagnosis not present

## 2018-06-19 DIAGNOSIS — F0391 Unspecified dementia with behavioral disturbance: Secondary | ICD-10-CM | POA: Diagnosis not present

## 2018-06-19 DIAGNOSIS — Z9181 History of falling: Secondary | ICD-10-CM | POA: Diagnosis not present

## 2018-06-24 DIAGNOSIS — R2689 Other abnormalities of gait and mobility: Secondary | ICD-10-CM | POA: Diagnosis not present

## 2018-06-24 DIAGNOSIS — R41841 Cognitive communication deficit: Secondary | ICD-10-CM | POA: Diagnosis not present

## 2018-06-24 DIAGNOSIS — Z87891 Personal history of nicotine dependence: Secondary | ICD-10-CM | POA: Diagnosis not present

## 2018-06-24 DIAGNOSIS — F0391 Unspecified dementia with behavioral disturbance: Secondary | ICD-10-CM | POA: Diagnosis not present

## 2018-06-24 DIAGNOSIS — Z9181 History of falling: Secondary | ICD-10-CM | POA: Diagnosis not present

## 2018-06-25 DIAGNOSIS — R41841 Cognitive communication deficit: Secondary | ICD-10-CM | POA: Diagnosis not present

## 2018-06-25 DIAGNOSIS — R2689 Other abnormalities of gait and mobility: Secondary | ICD-10-CM | POA: Diagnosis not present

## 2018-06-25 DIAGNOSIS — Z87891 Personal history of nicotine dependence: Secondary | ICD-10-CM | POA: Diagnosis not present

## 2018-06-25 DIAGNOSIS — Z9181 History of falling: Secondary | ICD-10-CM | POA: Diagnosis not present

## 2018-06-25 DIAGNOSIS — F0391 Unspecified dementia with behavioral disturbance: Secondary | ICD-10-CM | POA: Diagnosis not present

## 2018-06-26 DIAGNOSIS — Z9181 History of falling: Secondary | ICD-10-CM | POA: Diagnosis not present

## 2018-06-26 DIAGNOSIS — F0391 Unspecified dementia with behavioral disturbance: Secondary | ICD-10-CM | POA: Diagnosis not present

## 2018-06-26 DIAGNOSIS — R2689 Other abnormalities of gait and mobility: Secondary | ICD-10-CM | POA: Diagnosis not present

## 2018-06-26 DIAGNOSIS — Z87891 Personal history of nicotine dependence: Secondary | ICD-10-CM | POA: Diagnosis not present

## 2018-06-26 DIAGNOSIS — R41841 Cognitive communication deficit: Secondary | ICD-10-CM | POA: Diagnosis not present

## 2018-07-02 DIAGNOSIS — F0391 Unspecified dementia with behavioral disturbance: Secondary | ICD-10-CM | POA: Diagnosis not present

## 2018-07-02 DIAGNOSIS — Z9181 History of falling: Secondary | ICD-10-CM | POA: Diagnosis not present

## 2018-07-02 DIAGNOSIS — R41841 Cognitive communication deficit: Secondary | ICD-10-CM | POA: Diagnosis not present

## 2018-07-02 DIAGNOSIS — Z87891 Personal history of nicotine dependence: Secondary | ICD-10-CM | POA: Diagnosis not present

## 2018-07-02 DIAGNOSIS — R2689 Other abnormalities of gait and mobility: Secondary | ICD-10-CM | POA: Diagnosis not present

## 2018-07-04 DIAGNOSIS — Z87891 Personal history of nicotine dependence: Secondary | ICD-10-CM | POA: Diagnosis not present

## 2018-07-04 DIAGNOSIS — R2689 Other abnormalities of gait and mobility: Secondary | ICD-10-CM | POA: Diagnosis not present

## 2018-07-04 DIAGNOSIS — R41841 Cognitive communication deficit: Secondary | ICD-10-CM | POA: Diagnosis not present

## 2018-07-04 DIAGNOSIS — F0391 Unspecified dementia with behavioral disturbance: Secondary | ICD-10-CM | POA: Diagnosis not present

## 2018-07-04 DIAGNOSIS — Z9181 History of falling: Secondary | ICD-10-CM | POA: Diagnosis not present

## 2018-07-25 DIAGNOSIS — Z87891 Personal history of nicotine dependence: Secondary | ICD-10-CM | POA: Diagnosis not present

## 2018-07-25 DIAGNOSIS — R2689 Other abnormalities of gait and mobility: Secondary | ICD-10-CM | POA: Diagnosis not present

## 2018-07-25 DIAGNOSIS — F0391 Unspecified dementia with behavioral disturbance: Secondary | ICD-10-CM | POA: Diagnosis not present

## 2018-07-25 DIAGNOSIS — Z9181 History of falling: Secondary | ICD-10-CM | POA: Diagnosis not present

## 2018-07-25 DIAGNOSIS — R41841 Cognitive communication deficit: Secondary | ICD-10-CM | POA: Diagnosis not present

## 2018-09-05 ENCOUNTER — Ambulatory Visit: Payer: Self-pay | Admitting: Nurse Practitioner

## 2018-09-26 ENCOUNTER — Ambulatory Visit: Payer: Self-pay | Admitting: Nurse Practitioner

## 2018-09-27 ENCOUNTER — Encounter: Payer: Self-pay | Admitting: Nurse Practitioner

## 2018-09-27 ENCOUNTER — Ambulatory Visit (INDEPENDENT_AMBULATORY_CARE_PROVIDER_SITE_OTHER): Payer: Medicare Other | Admitting: Nurse Practitioner

## 2018-09-27 VITALS — BP 128/72 | HR 66 | Temp 97.5°F | Ht 65.03 in | Wt 154.0 lb

## 2018-09-27 DIAGNOSIS — H6123 Impacted cerumen, bilateral: Secondary | ICD-10-CM | POA: Diagnosis not present

## 2018-09-27 DIAGNOSIS — J302 Other seasonal allergic rhinitis: Secondary | ICD-10-CM

## 2018-09-27 DIAGNOSIS — F0391 Unspecified dementia with behavioral disturbance: Secondary | ICD-10-CM | POA: Diagnosis not present

## 2018-09-27 DIAGNOSIS — L989 Disorder of the skin and subcutaneous tissue, unspecified: Secondary | ICD-10-CM | POA: Diagnosis not present

## 2018-09-27 DIAGNOSIS — N3281 Overactive bladder: Secondary | ICD-10-CM

## 2018-09-27 MED ORDER — MEMANTINE HCL 28 X 5 MG & 21 X 10 MG PO TABS: ORAL_TABLET | ORAL | 12 refills | Status: DC

## 2018-09-27 MED ORDER — MIRABEGRON ER 25 MG PO TB24: 25.0000 mg | ORAL_TABLET | Freq: Every day | ORAL | 1 refills | Status: DC

## 2018-09-27 NOTE — Progress Notes (Signed)
Careteam: Patient Care Team: Neale Burly, MD as PCP - General (Internal Medicine)  Advanced Directive information Does Patient Have a Medical Advance Directive?: Yes, Type of Advance Directive: [place], Does patient want to make changes to medical advance directive?: No - Patient declined  Allergies  Allergen Reactions  . Codeine     No details provided  . Penicillins     No details provided    Chief Complaint  Patient presents with  . Establish Care    New patient establish care, moderate fall risk, please advise. Patient's wife concerned about bed wetting. Here with wife and daughter [name]      HPI: Patient is a 85 y.o. male seen in the office today to establish care. Changing offices (convenience)  Fasting currently.  Daughter and wife at office visit today. Feels like last physical was in November.  Pt with hx of advanced dementia, wife is primary caregiver at home. He was diagnosised with severe dementia per family. 8/30 on last MMSE last year.   Seasonal allergies- uses occasional benadryl PRN  Multiple skin cancers removed- pt does not wish to continue to follow up with dermatology   Urinary frequency- ongoing for the last year and a half. Having more episodes of incontinence.  Review of Systems:  Review of Systems  Constitutional: Positive for malaise/fatigue. Negative for chills, fever and weight loss.  HENT: Positive for hearing loss. Negative for congestion and sinus pain.   Eyes:       Dry eyes  Respiratory: Negative for cough and shortness of breath.   Cardiovascular: Negative for chest pain.  Genitourinary: Positive for frequency. Negative for dysuria.       Urinary incontinence   Musculoskeletal: Positive for falls.       2 falls in the last 2 years  Skin: Positive for itching and rash.       Itchy dry skin  Endo/Heme/Allergies: Positive for environmental allergies.  Psychiatric/Behavioral: Positive for memory loss. The  patient is nervous/anxious.     Past Medical History:  Diagnosis Date  . 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 Flu 1999   Hospitalized by Dr. Lenord Fellers IM   Past Surgical History:  Procedure Laterality Date  . HERNIA REPAIR    . KIDNEY STONE SURGERY     Basket extraction by Dr. Michela Pitcher    Social History:   reports that he quit smoking about 45 years ago. His smoking use included cigarettes. He has a 25.00 pack-year smoking history. He has never used smokeless tobacco. He reports previous alcohol use. He reports that he does not use drugs.  Family History  Problem Relation Age of Onset  . Pneumonia Mother   . Cancer Brother   . Brain cancer Brother   . Alzheimer's disease Sister   . Breast cancer Sister     Medications: Patient's Medications  New Prescriptions   No medications on file  Previous Medications   DIPHENHYDRAMINE (BENADRYL) 50 MG TABLET    Take 50 mg by mouth as needed for allergies.  Modified Medications   No medications on file  Discontinued Medications   No medications on file     Physical Exam:  Vitals:   09/27/18 1337  BP: 128/72  Pulse: 66  Temp: (!) 97.5 F (36.4 C)  TempSrc: Oral  SpO2: 92%  Weight: 154 lb (69.9 kg)  Height: 5' 5.03" (1.652 m)   Body mass index is 25.61 kg/m.  Physical Exam Constitutional:      General: He is not in acute distress.    Appearance: He is well-developed. He is not diaphoretic.     Comments: Frail thin built elderly man  HENT:     Head: Normocephalic and atraumatic.     Nose: Nose normal.     Mouth/Throat:     Mouth: Mucous membranes are moist.     Pharynx: No oropharyngeal exudate.  Eyes:     Conjunctiva/sclera: Conjunctivae normal.     Pupils: Pupils are equal, round, and reactive to light.  Neck:     Musculoskeletal: Normal range of motion and neck supple.  Cardiovascular:     Rate and Rhythm: Normal rate and regular rhythm.     Heart sounds: Normal heart sounds.  Pulmonary:     Effort:  Pulmonary effort is normal.     Breath sounds: Normal breath sounds.  Abdominal:     General: Bowel sounds are normal.     Palpations: Abdomen is soft.  Musculoskeletal:        General: No tenderness.  Skin:    General: Skin is warm and dry.     Findings: Lesion (multiple lesions to hands, face and bleeding larger lesion on left arm (suspected cancer, nonhealing) has gone to dermatology but does not wish to have further follow up) present.  Neurological:     Mental Status: He is alert and oriented to person, place, and time.     Labs reviewed: Basic Metabolic Panel: No results for input(s): NA, K, CL, CO2, GLUCOSE, BUN, CREATININE, CALCIUM, MG, PHOS, TSH in the last 8760 hours. Liver Function Tests: No results for input(s): AST, ALT, ALKPHOS, BILITOT, PROT, ALBUMIN in the last 8760 hours. No results for input(s): LIPASE, AMYLASE in the last 8760 hours. No results for input(s): AMMONIA in the last 8760 hours. CBC: No results for input(s): WBC, NEUTROABS, HGB, HCT, MCV, PLT in the last 8760 hours. Lipid Panel: No results for input(s): CHOL, HDL, LDLCALC, TRIG, CHOLHDL, LDLDIRECT in the last 8760 hours. TSH: No results for input(s): TSH in the last 8760 hours. A1C: No results found for: HGBA1C   Assessment/Plan 1. Dementia with behavioral disturbance, unspecified dementia type (HCC) -severe dementia, lives with wife who is primary caregiver. Currently not on any medications. Noticing some behaviors/anxieties associated with memory loss. Will start namenda titration pack at this time.  - CBC with Differential/Platelets - COMPLETE METABOLIC PANEL WITH GFR - memantine (NAMENDA TITRATION PAK) tablet pack; 5 mg/day for =1 week; 5 mg twice daily for =1 week; 15 mg/day given in 5 mg and 10 mg separated doses for =1 week; then 10 mg twice daily  Dispense: 49 tablet; Refill: 12  2. Overactive bladder -major issue at this time with episodes of incontinence.  - mirabegron ER (MYRBETRIQ) 25  MG TB24 tablet; Take 1 tablet (25 mg total) by mouth daily.  Dispense: 30 tablet; Refill: 1  3. Seasonal allergies Stop benadryl to use claritin or zyrtec 10 mg daily as needed OTC  4. Bilateral impacted cerumen Bilateral ear cerumen impaction. Unable to remove by lavage and used grabber but unable to fully remove. Plan to use debrox and follow up in 4 days to flush  5. Skin lesions Multiple types of lesions noted on arms, face and forearms. Wife feels like he has had some precancerous lesions noted but at this time due to dementia he is no longer following up with dermatologist.     Next appt: 6 week  for follow up.  [name]. [place], [facility] [phone]

## 2018-09-27 NOTE — Patient Instructions (Addendum)
Claritin or zyrtec 10 mg tablet OTC instead of benadryl.   To start Myrbetriq 25 mg by mouth daily for overactive bladder    After 2 weeks start namenda titration pack    Follow up in 6 weeks for routine follow up

## 2018-09-28 LAB — COMPLETE METABOLIC PANEL WITH GFR
AG Ratio: 1.1 (calc) (ref 1.0–2.5)
ALBUMIN MSPROF: 3.8 g/dL (ref 3.6–5.1)
ALT: 12 U/L (ref 9–46)
AST: 19 U/L (ref 10–35)
Alkaline phosphatase (APISO): 80 U/L (ref 35–144)
BUN / CREAT RATIO: 18 (calc) (ref 6–22)
BUN: 22 mg/dL (ref 7–25)
CALCIUM: 9.4 mg/dL (ref 8.6–10.3)
CO2: 29 mmol/L (ref 20–32)
CREATININE: 1.21 mg/dL — AB (ref 0.70–1.11)
Chloride: 101 mmol/L (ref 98–110)
GFR, EST NON AFRICAN AMERICAN: 51 mL/min/{1.73_m2} — AB (ref >=60)
GFR, Est African American: 59 mL/min/{1.73_m2} — ABNORMAL LOW (ref >=60)
Globulin: 3.4 g/dL (calc) (ref 1.9–3.7)
Glucose, Bld: 83 mg/dL (ref 65–139)
Potassium: 4.7 mmol/L (ref 3.5–5.3)
SODIUM: 139 mmol/L (ref 135–146)
TOTAL PROTEIN: 7.2 g/dL (ref 6.1–8.1)
Total Bilirubin: 1.1 mg/dL (ref 0.2–1.2)

## 2018-09-28 LAB — CBC WITH DIFFERENTIAL/PLATELET
ABSOLUTE MONOCYTES: 880 {cells}/uL (ref 200–950)
Basophils Absolute: 42 cells/uL (ref 0–200)
Basophils Relative: 0.4 %
Eosinophils Absolute: 148 cells/uL (ref 15–500)
Eosinophils Relative: 1.4 %
HCT: 43.8 % (ref 38.5–50.0)
HEMOGLOBIN: 14.3 g/dL (ref 13.2–17.1)
LYMPHS ABS: 2692 {cells}/uL (ref 850–3900)
MCH: 28.5 pg (ref 27.0–33.0)
MCHC: 32.6 g/dL (ref 32.0–36.0)
MCV: 87.4 fL (ref 80.0–100.0)
MPV: 10.5 fL (ref 7.5–12.5)
Monocytes Relative: 8.3 %
NEUTROS ABS: 6837 {cells}/uL (ref 1500–7800)
NEUTROS PCT: 64.5 %
Platelets: 263 10*3/uL (ref 140–400)
RBC: 5.01 10*6/uL (ref 4.20–5.80)
RDW: 12.4 % (ref 11.0–15.0)
Total Lymphocyte: 25.4 %
WBC: 10.6 10*3/uL (ref 3.8–10.8)

## 2018-10-04 DIAGNOSIS — J111 Influenza due to unidentified influenza virus with other respiratory manifestations: Secondary | ICD-10-CM | POA: Diagnosis not present

## 2018-10-04 DIAGNOSIS — J029 Acute pharyngitis, unspecified: Secondary | ICD-10-CM | POA: Diagnosis not present

## 2018-10-31 DIAGNOSIS — W19XXXA Unspecified fall, initial encounter: Secondary | ICD-10-CM | POA: Diagnosis not present

## 2018-10-31 DIAGNOSIS — R410 Disorientation, unspecified: Secondary | ICD-10-CM | POA: Diagnosis not present

## 2018-11-04 ENCOUNTER — Other Ambulatory Visit: Payer: Self-pay

## 2018-11-04 ENCOUNTER — Ambulatory Visit (INDEPENDENT_AMBULATORY_CARE_PROVIDER_SITE_OTHER): Payer: Medicare Other | Admitting: Nurse Practitioner

## 2018-11-04 ENCOUNTER — Encounter: Payer: Self-pay | Admitting: Nurse Practitioner

## 2018-11-04 DIAGNOSIS — F0391 Unspecified dementia with behavioral disturbance: Secondary | ICD-10-CM | POA: Diagnosis not present

## 2018-11-04 DIAGNOSIS — N3281 Overactive bladder: Secondary | ICD-10-CM

## 2018-11-04 DIAGNOSIS — W19XXXA Unspecified fall, initial encounter: Secondary | ICD-10-CM | POA: Diagnosis not present

## 2018-11-04 DIAGNOSIS — J302 Other seasonal allergic rhinitis: Secondary | ICD-10-CM | POA: Diagnosis not present

## 2018-11-04 DIAGNOSIS — T148XXA Other injury of unspecified body region, initial encounter: Secondary | ICD-10-CM

## 2018-11-04 NOTE — Patient Instructions (Signed)
Fall Prevention in the Home, Adult  Falls can cause injuries. They can happen to people of all ages. There are many things you can do to make your home safe and to help prevent falls. Ask for help when making these changes, if needed.  What actions can I take to prevent falls?  General Instructions  · Use good lighting in all rooms. Replace any light bulbs that burn out.  · Turn on the lights when you go into a dark area. Use night-lights.  · Keep items that you use often in easy-to-reach places. Lower the shelves around your home if necessary.  · Set up your furniture so you have a clear path. Avoid moving your furniture around.  · Do not have throw rugs and other things on the floor that can make you trip.  · Avoid walking on wet floors.  · If any of your floors are uneven, fix them.  · Add color or contrast paint or tape to clearly mark and help you see:  ? Any grab bars or handrails.  ? First and last steps of stairways.  ? Where the edge of each step is.  · If you use a stepladder:  ? Make sure that it is fully opened. Do not climb a closed stepladder.  ? Make sure that both sides of the stepladder are locked into place.  ? Ask someone to hold the stepladder for you while you use it.  · If there are any pets around you, be aware of where they are.  What can I do in the bathroom?         · Keep the floor dry. Clean up any water that spills onto the floor as soon as it happens.  · Remove soap buildup in the tub or shower regularly.  · Use non-skid mats or decals on the floor of the tub or shower.  · Attach bath mats securely with double-sided, non-slip rug tape.  · If you need to sit down in the shower, use a plastic, non-slip stool.  · Install grab bars by the toilet and in the tub and shower. Do not use towel bars as grab bars.  What can I do in the bedroom?  · Make sure that you have a light by your bed that is easy to reach.  · Do not use any sheets or blankets that are too big for your bed. They should  not hang down onto the floor.  · Have a firm chair that has side arms. You can use this for support while you get dressed.  What can I do in the kitchen?  · Clean up any spills right away.  · If you need to reach something above you, use a strong step stool that has a grab bar.  · Keep electrical cords out of the way.  · Do not use floor polish or wax that makes floors slippery. If you must use wax, use non-skid floor wax.  What can I do with my stairs?  · Do not leave any items on the stairs.  · Make sure that you have a light switch at the top of the stairs and the bottom of the stairs. If you do not have them, ask someone to add them for you.  · Make sure that there are handrails on both sides of the stairs, and use them. Fix handrails that are broken or loose. Make sure that handrails are as long as the stairways.  ·   Install non-slip stair treads on all stairs in your home.  · Avoid having throw rugs at the top or bottom of the stairs. If you do have throw rugs, attach them to the floor with carpet tape.  · Choose a carpet that does not hide the edge of the steps on the stairway.  · Check any carpeting to make sure that it is firmly attached to the stairs. Fix any carpet that is loose or worn.  What can I do on the outside of my home?  · Use bright outdoor lighting.  · Regularly fix the edges of walkways and driveways and fix any cracks.  · Remove anything that might make you trip as you walk through a door, such as a raised step or threshold.  · Trim any bushes or trees on the path to your home.  · Regularly check to see if handrails are loose or broken. Make sure that both sides of any steps have handrails.  · Install guardrails along the edges of any raised decks and porches.  · Clear walking paths of anything that might make someone trip, such as tools or rocks.  · Have any leaves, snow, or ice cleared regularly.  · Use sand or salt on walking paths during winter.  · Clean up any spills in your garage right  away. This includes grease or oil spills.  What other actions can I take?  · Wear shoes that:  ? Have a low heel. Do not wear high heels.  ? Have rubber bottoms.  ? Are comfortable and fit you well.  ? Are closed at the toe. Do not wear open-toe sandals.  · Use tools that help you move around (mobility aids) if they are needed. These include:  ? Canes.  ? Walkers.  ? Scooters.  ? Crutches.  · Review your medicines with your doctor. Some medicines can make you feel dizzy. This can increase your chance of falling.  Ask your doctor what other things you can do to help prevent falls.  Where to find more information  · Centers for Disease Control and Prevention, [employer]: https://cdc.gov  · National Institute on Aging: https://go4life.nia.nih.gov  Contact a doctor if:  · You are afraid of falling at home.  · You feel weak, drowsy, or dizzy at home.  · You fall at home.  Summary  · There are many simple things that you can do to make your home safe and to help prevent falls.  · Ways to make your home safe include removing tripping hazards and installing grab bars in the bathroom.  · Ask for help when making these changes in your home.  This information is not intended to replace advice given to you by your health care provider. Make sure you discuss any questions you have with your health care provider.  Document Released: 05/06/2009 Document Revised: 02/22/2017 Document Reviewed: 02/22/2017  Elsevier Interactive Patient Education © 2019 Elsevier Inc.

## 2018-11-04 NOTE — Progress Notes (Signed)
This service is provided via telemedicine  No vital signs collected/recorded due to the encounter was a telemedicine visit.   Location of patient (ex: home, work): Home  Patient consents to a telephone visit: Yes  Location of the provider (ex: office, home):  [employer], Office   Names of all persons participating in the telemedicine service and their role in the encounter:  S.Chrae B/CMA, Sherrie Mustache, NP, daughter [name]) and Patient   Time spent on call:  10 min with medical assistant  Virtual Visit via Telephone Note  I connected with Roger Gardner on 11/04/18 at  3:45 PM EDT by telephone and verified that I am speaking with the correct person using two identifiers.   I discussed the limitations, risks, security and privacy concerns of performing an evaluation and management service by telephone and the availability of in person appointments. I also discussed with the patient that there may be a patient responsible charge related to this service. The patient expressed understanding and agreed to proceed.     Careteam: Patient Care Team: [name], NP as PCP - General (Geriatric Medicine)  Advanced Directive information    Allergies  Allergen Reactions   Codeine     No details provided   Penicillins     No details provided    Chief Complaint  Patient presents with   Acute Visit    Fall, patient fell on Thursday 10/31/2018 due to balance loss. Patient was walking w/o assisted device. Patient injured left elbow. EMT was called, area was dressed and cream provided. Moderate fall risk. Tele-visit    Medication Management    Bladder medication not helping     HPI: Patient is a 85 y.o. male via tele-visit due to fall.  Daughter on call, pt with dementia On 10/31/2018 5 days was outside with wife and was in bedroom shoes and he decided to go on a walk. He went into the gravel and lost his balance and tried to catch himself but ended up  falling. The neighbor helped get him up in a chair. daughter called EMS so they could look at him. EMT looked him over and check up out and felt like he did not break anything.  On his left arm he scraped his elbow and they applied a dressing and told wife about wound care. Area looks good per daughter. No sign of infection. Changing dressing twice daily and then went to once daily. Now area is healed. Sore from fall initially but currently no pain from fall.   Concerned over another fall.  Has trouble with balance and He uses walker inside. Has adjusted walker which has helped.  Talked about getting toilet seat Has home health which came out and did safety assessment and gave exercises. Now they just need someone to help with care (baths,etc)  When he stands up gets very short of breath but when he sits back down after a few mins is better. Very inactive.   Did not start namenda because other daughter stated it was for "younger" dementia patients and did not feel like it would be beneficial; still thinking about starting it.    Overactive bladder- myrbetriq not that effective so far but he is wearing depends which has been beneficial.   Seasonal allergies- stopped benadryl and using claritin (took this morning). Was stopped up but this helped symptoms.   Reports he had the flu tested positive for influenza A- got tamiflu and improved after a  few days.   Review of Systems:  Review of Systems  Constitutional: Negative for chills, fever and malaise/fatigue.  Respiratory: Positive for shortness of breath (with activity). Negative for cough.   Cardiovascular: Negative for chest pain, claudication and leg swelling.  Gastrointestinal: Negative for abdominal pain, constipation, diarrhea, heartburn, nausea and vomiting.  Genitourinary: Positive for frequency. Negative for dysuria and urgency.  Musculoskeletal: Positive for falls. Negative for myalgias.  Neurological: Positive for dizziness  (occasionally) and weakness. Negative for headaches.  Psychiatric/Behavioral: Positive for memory loss. Negative for depression. The patient is not nervous/anxious and does not have insomnia.     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 Lenord Fellers IM   Past Surgical History:  Procedure Laterality Date   HERNIA REPAIR     KIDNEY STONE SURGERY     Basket extraction by Dr. Michela Pitcher    Social History:   reports that he quit smoking about 45 years ago. His smoking use included cigarettes. He has a 25.00 pack-year smoking history. He has never used smokeless tobacco. He reports previous alcohol use. He reports that he does not use drugs.  Family History  Problem Relation Age of Onset   Pneumonia Mother    Cancer Brother    Brain cancer Brother    Alzheimer's disease Sister    Breast cancer Sister     Medications: Patient's Medications  New Prescriptions   No medications on file  Previous Medications   DIPHENHYDRAMINE (BENADRYL) 50 MG TABLET    Take 50 mg by mouth as needed for allergies.   LORATADINE (CLARITIN) 10 MG TABLET    Take 10 mg by mouth as needed for allergies.   MEMANTINE (NAMENDA TITRATION PAK) TABLET PACK    5 mg/day for =1 week; 5 mg twice daily for =1 week; 15 mg/day given in 5 mg and 10 mg separated doses for =1 week; then 10 mg twice daily   MIRABEGRON ER (MYRBETRIQ) 25 MG TB24 TABLET    Take 1 tablet (25 mg total) by mouth daily.  Modified Medications   No medications on file  Discontinued Medications   No medications on file     Physical Exam:unable due to televisit    Labs reviewed: Basic Metabolic Panel: Recent Labs    09/27/18 1447  NA 139  K 4.7  CL 101  CO2 29  GLUCOSE 83  BUN 22  CREATININE 1.21*  CALCIUM 9.4   Liver Function Tests: Recent Labs    09/27/18 1447  AST 19  ALT 12  BILITOT 1.1  PROT 7.2   No results for input(s): LIPASE, AMYLASE in the last  8760 hours. No results for input(s): AMMONIA in the last 8760 hours. CBC: Recent Labs    09/27/18 1447  WBC 10.6  NEUTROABS 6,837  HGB 14.3  HCT 43.8  MCV 87.4  PLT 263   Lipid Panel: No results for input(s): CHOL, HDL, LDLCALC, TRIG, CHOLHDL, LDLDIRECT in the last 8760 hours. TSH: No results for input(s): TSH in the last 8760 hours. A1C: No results found for: HGBA1C   Assessment/Plan 1. Fall, initial encounter -encouraged family to remind patient to use walker, wear proper footwear and to make sure there was things he could trip over in the home. He frequently forgets walker due to dementia but wife reminding him.  - Ambulatory referral to [facility] for fall prevention program.   2.  Abrasion After fall, has healed at this time.  3. Dementia with behavioral disturbance, unspecified dementia type ([place]) -ongoing, wife is primary caregiver. Unsure if they will start namenda due to age. Continues with supportive care with family.  - Ambulatory referral to [facility]  4. Overactive bladder Unsure if getting much benefit from myrbetriq however wearing depends which has helped. To continue myrbetriq however if not seeing benefit at 8 weeks to DC  5. Seasonal allergies Continue on Claritin 10 mg by mouth daily   Next appt: 4 months.  [name]. Harle Battiest  [facility] [phone]   Follow Up Instructions:    I discussed the assessment and treatment plan with the patient. The patient was provided an opportunity to ask questions and all were answered. The patient agreed with the plan and demonstrated an understanding of the instructions.   The patient was advised to call back or seek an in-person evaluation if the symptoms worsen or if the condition fails to improve as anticipated.  I provided 26 minutes of non-face-to-face time during this encounter.   [name], NP

## 2018-11-06 DIAGNOSIS — R339 Retention of urine, unspecified: Secondary | ICD-10-CM | POA: Diagnosis not present

## 2018-11-06 DIAGNOSIS — M6281 Muscle weakness (generalized): Secondary | ICD-10-CM | POA: Diagnosis not present

## 2018-11-06 DIAGNOSIS — F0391 Unspecified dementia with behavioral disturbance: Secondary | ICD-10-CM | POA: Diagnosis not present

## 2018-11-06 DIAGNOSIS — Z743 Need for continuous supervision: Secondary | ICD-10-CM

## 2018-11-06 DIAGNOSIS — R41841 Cognitive communication deficit: Secondary | ICD-10-CM

## 2018-11-06 DIAGNOSIS — J302 Other seasonal allergic rhinitis: Secondary | ICD-10-CM | POA: Diagnosis not present

## 2018-11-06 DIAGNOSIS — R296 Repeated falls: Secondary | ICD-10-CM | POA: Diagnosis not present

## 2018-11-06 DIAGNOSIS — Z87891 Personal history of nicotine dependence: Secondary | ICD-10-CM | POA: Diagnosis not present

## 2018-11-07 ENCOUNTER — Ambulatory Visit: Payer: Medicare Other | Admitting: Nurse Practitioner

## 2018-11-08 ENCOUNTER — Ambulatory Visit (INDEPENDENT_AMBULATORY_CARE_PROVIDER_SITE_OTHER): Payer: Medicare Other | Admitting: Adult Health

## 2018-11-08 ENCOUNTER — Other Ambulatory Visit: Payer: Self-pay

## 2018-11-08 ENCOUNTER — Ambulatory Visit: Payer: Medicare Other | Admitting: Family

## 2018-11-08 ENCOUNTER — Encounter: Payer: Self-pay | Admitting: Adult Health

## 2018-11-08 DIAGNOSIS — R8290 Unspecified abnormal findings in urine: Secondary | ICD-10-CM | POA: Diagnosis not present

## 2018-11-08 DIAGNOSIS — R339 Retention of urine, unspecified: Secondary | ICD-10-CM | POA: Diagnosis not present

## 2018-11-08 DIAGNOSIS — R600 Localized edema: Secondary | ICD-10-CM | POA: Diagnosis not present

## 2018-11-08 DIAGNOSIS — F0391 Unspecified dementia with behavioral disturbance: Secondary | ICD-10-CM | POA: Diagnosis not present

## 2018-11-08 DIAGNOSIS — R1311 Dysphagia, oral phase: Secondary | ICD-10-CM | POA: Diagnosis not present

## 2018-11-08 DIAGNOSIS — R296 Repeated falls: Secondary | ICD-10-CM | POA: Diagnosis not present

## 2018-11-08 DIAGNOSIS — F0390 Unspecified dementia without behavioral disturbance: Secondary | ICD-10-CM | POA: Diagnosis not present

## 2018-11-08 DIAGNOSIS — Z87891 Personal history of nicotine dependence: Secondary | ICD-10-CM | POA: Diagnosis not present

## 2018-11-08 DIAGNOSIS — M6281 Muscle weakness (generalized): Secondary | ICD-10-CM | POA: Diagnosis not present

## 2018-11-08 DIAGNOSIS — J302 Other seasonal allergic rhinitis: Secondary | ICD-10-CM | POA: Diagnosis not present

## 2018-11-08 DIAGNOSIS — N390 Urinary tract infection, site not specified: Secondary | ICD-10-CM | POA: Diagnosis not present

## 2018-11-08 DIAGNOSIS — R531 Weakness: Secondary | ICD-10-CM | POA: Diagnosis not present

## 2018-11-08 LAB — CBC AND DIFFERENTIAL
HCT: 41 (ref 41–53)
Hemoglobin: 12.8 — AB (ref 13.5–17.5)
Platelets: 174 (ref 150–399)
WBC: 14.4

## 2018-11-08 LAB — BASIC METABOLIC PANEL
BUN: 85 — AB (ref 4–21)
Creatinine: 7.3 — AB (ref 0.6–1.3)
Glucose: 105
Potassium: 6.1 — AB (ref 3.4–5.3)
Sodium: 140 (ref 137–147)

## 2018-11-08 NOTE — Patient Instructions (Addendum)
1. Localized edema - abdominal distention and bilateral feet edema is thought to be related to decreased activity,  elevate feet when sitting and sleeping. Change position using pillows (turn left/right every hour)  2. Generalized weakness - do range of motion exercises, continue PT and ask regarding exercises the family can do, will order CBC and BMP  3. Foul smelling urine - will order urinalysis with culture and sensitivity to rule out UTI, encourage to increase fluid intake  4. Oral phase dysphagia - noted to be pocketing food, will order speech therapy to evaluate dysphagia, aspiration precautions sit upright when eating  5. Dementia without behavioral disturbance, unspecified dementia type ([place]) - continue Namenda, supportive care and fall precations    Call the office if condition fails to improve.

## 2018-11-08 NOTE — Progress Notes (Signed)
This service is provided via telemedicine  No vital signs collected/recorded due to the encounter was a telemedicine visit.   Location of patient (ex: home, work):  Home   Patient consents to a telephone visit:  Yes  Location of the provider (ex: office, home):  Office   Name of any referring provider:  Sherrie Mustache, NP  Names of all persons participating in the telemedicine service and their role in the encounter:  Ruthell Rummage CMA, Durenda Age NP, Lacey Jensen, and Daughter   Time spent on call:  Ruthell Rummage CMA spent 6  Minutes with patient and daughter on phone.       DATE:  11/08/2018 MRN:  [id number]  BIRTHDAY: 1921/08/07   Contact Information    Name Relation Home Work Mobile   Wassmer,BETTY  [phone]            Chief Complaint  Patient presents with  . Acute Visit    Adominal distention and leg swelling just started 2 days ago. Patient also fell last week in driveway.     HISTORY OF PRESENT ILLNESS:  This is a 85 year old male with wife and daughter over the phone. He has PMH of dementia  allergy and urinary incontinence. It was noted that he has slight distention of his abdomen and edema of both feet. He fell on their driveway last week and EMS was called. He suffered abrasions according to daughter but was negative for fractures. He was noted to have decreased activity and seems to be afraid of walking. He now sits on a recliner with feet down. Daughter, also, reported that he seems to be pocketing food. He eats and drinks. Wife said that he is incontinent and is soaking wet in the morning. During the day his diaper gets changed twice a day. Daughter noted that his urine has a foul smell. No fever was noted.   PAST MEDICAL HISTORY:  Past Medical History:  Diagnosis Date  . 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 Flu 1999   Hospitalized by Dr. Manuella Ghazi - [facility]     CURRENT MEDICATIONS: Reviewed  Patient's Medications  New Prescriptions    No medications on file  Previous Medications   DIPHENHYDRAMINE (BENADRYL) 50 MG TABLET    Take 50 mg by mouth as needed for allergies.   LORATADINE (CLARITIN) 10 MG TABLET    Take 10 mg by mouth as needed for allergies.   MEMANTINE (NAMENDA TITRATION PAK) TABLET PACK    5 mg/day for =1 week; 5 mg twice daily for =1 week; 15 mg/day given in 5 mg and 10 mg separated doses for =1 week; then 10 mg twice daily   MIRABEGRON ER (MYRBETRIQ) 25 MG TB24 TABLET    Take 1 tablet (25 mg total) by mouth daily.  Modified Medications   No medications on file  Discontinued Medications   No medications on file     Allergies  Allergen Reactions  . Codeine     No details provided  . Penicillins     No details provided     REVIEW OF SYSTEMS:  GENERAL: No fever or chills  MOUTH and THROAT: Denies oral discomfort, gingival pain or bleeding, pain from teeth or hoarseness   RESPIRATORY: no cough, SOB, DOE, wheezing, hemoptysis CARDIAC: no chest pain, or palpitations, +edema GI: +abdominal distention GU: +incontinence MUSCULOSKELETAL: afraid of walking post fall incident NEUROLOGICAL: Denies dizziness, syncope, numbness, or headache PSYCHIATRIC: Denies feeling of depression or  anxiety. No report of hallucinations, insomnia, paranoia, or agitation     LABS/RADIOLOGY: Labs reviewed: Basic Metabolic Panel: Recent Labs    09/27/18 1447  NA 139  K 4.7  CL 101  CO2 29  GLUCOSE 83  BUN 22  CREATININE 1.21*  CALCIUM 9.4   Liver Function Tests: Recent Labs    09/27/18 1447  AST 19  ALT 12  BILITOT 1.1  PROT 7.2   CBC: Recent Labs    09/27/18 1447  WBC 10.6  NEUTROABS 6,837  HGB 14.3  HCT 43.8  MCV 87.4  PLT 263       ASSESSMENT/PLAN:  1. Localized edema - abdominal distention and bilateral feet edema is thought to be related to decreased activity, instructed daughter to elevate feet when sitting and sleeping. Change position using pillows (turn left/right every hour)   2. Generalized weakness - do range of motion exercises, continue PT and ask regarding exercises the family can do, will order CBC and BMP  3. Foul smelling urine - will order urinalysis with culture and sensitivity to rule out UTI, encourage to increase fluid intake  4. Oral phase dysphagia - noted to be pocketing food, will order speech therapy to evaluate dysphagia, aspiration precaution, sit upright when eating  5. Dementia without behavioral disturbance, unspecified dementia type ([place]) - continue Namenda, supportive care and fall precations    Time spent on non face to face visit:  32  The patient gave consent to this telephone visit. Explained to the patient the risk and privacy issue that was involved with this telephone call.   Durenda Age, NP [employer] [phone]

## 2018-11-11 ENCOUNTER — Telehealth: Payer: Self-pay | Admitting: *Deleted

## 2018-11-11 ENCOUNTER — Other Ambulatory Visit: Payer: Self-pay

## 2018-11-11 ENCOUNTER — Encounter: Payer: Self-pay | Admitting: *Deleted

## 2018-11-11 ENCOUNTER — Emergency Department (HOSPITAL_COMMUNITY)
Admission: EM | Admit: 2018-11-11 | Discharge: 2018-11-11 | Disposition: A | Discharge status: Home / Self Care | Payer: Medicare Other | Attending: Emergency Medicine | Admitting: Emergency Medicine

## 2018-11-11 ENCOUNTER — Encounter (HOSPITAL_COMMUNITY): Payer: Self-pay

## 2018-11-11 DIAGNOSIS — N179 Acute kidney failure, unspecified: Secondary | ICD-10-CM

## 2018-11-11 DIAGNOSIS — M6281 Muscle weakness (generalized): Secondary | ICD-10-CM | POA: Diagnosis not present

## 2018-11-11 DIAGNOSIS — Z87891 Personal history of nicotine dependence: Secondary | ICD-10-CM | POA: Diagnosis not present

## 2018-11-11 DIAGNOSIS — J302 Other seasonal allergic rhinitis: Secondary | ICD-10-CM | POA: Diagnosis not present

## 2018-11-11 DIAGNOSIS — R339 Retention of urine, unspecified: Secondary | ICD-10-CM | POA: Diagnosis not present

## 2018-11-11 DIAGNOSIS — R404 Transient alteration of awareness: Secondary | ICD-10-CM | POA: Diagnosis not present

## 2018-11-11 DIAGNOSIS — R296 Repeated falls: Secondary | ICD-10-CM | POA: Diagnosis not present

## 2018-11-11 DIAGNOSIS — F0391 Unspecified dementia with behavioral disturbance: Secondary | ICD-10-CM | POA: Diagnosis not present

## 2018-11-11 LAB — COMPREHENSIVE METABOLIC PANEL
ALT: 23 U/L (ref 0–44)
AST: 22 U/L (ref 15–41)
Albumin: 3.1 g/dL — ABNORMAL LOW (ref 3.5–5.0)
Alkaline Phosphatase: 64 U/L (ref 38–126)
Anion gap: 7 (ref 5–15)
BUN: 30 mg/dL — ABNORMAL HIGH (ref 8–23)
CO2: 27 mmol/L (ref 22–32)
Calcium: 8.6 mg/dL — ABNORMAL LOW (ref 8.9–10.3)
Chloride: 109 mmol/L (ref 98–111)
Creatinine, Ser: 1.29 mg/dL — ABNORMAL HIGH (ref 0.61–1.24)
GFR calc Af Amer: 54 mL/min — ABNORMAL LOW (ref >60)
GFR calc non Af Amer: 47 mL/min — ABNORMAL LOW (ref >60)
Glucose, Bld: 122 mg/dL — ABNORMAL HIGH (ref 70–99)
Potassium: 4.2 mmol/L (ref 3.5–5.1)
Sodium: 143 mmol/L (ref 135–145)
Total Bilirubin: 0.5 mg/dL (ref 0.3–1.2)
Total Protein: 6.9 g/dL (ref 6.5–8.1)

## 2018-11-11 LAB — CBC WITH DIFFERENTIAL/PLATELET
Abs Immature Granulocytes: 0.29 10*3/uL — ABNORMAL HIGH (ref 0.00–0.07)
Basophils Absolute: 0.1 10*3/uL (ref 0.0–0.1)
Basophils Relative: 0 %
Eosinophils Absolute: 0.5 10*3/uL (ref 0.0–0.5)
Eosinophils Relative: 3 %
HCT: 39.6 % (ref 39.0–52.0)
Hemoglobin: 11.9 g/dL — ABNORMAL LOW (ref 13.0–17.0)
Immature Granulocytes: 2 %
Lymphocytes Relative: 17 %
Lymphs Abs: 2.7 10*3/uL (ref 0.7–4.0)
MCH: 28.1 pg (ref 26.0–34.0)
MCHC: 30.1 g/dL (ref 30.0–36.0)
MCV: 93.6 fL (ref 80.0–100.0)
Monocytes Absolute: 1.2 10*3/uL — ABNORMAL HIGH (ref 0.1–1.0)
Monocytes Relative: 7 %
Neutro Abs: 11.7 10*3/uL — ABNORMAL HIGH (ref 1.7–7.7)
Neutrophils Relative %: 71 %
Platelets: 260 10*3/uL (ref 150–400)
RBC: 4.23 MIL/uL (ref 4.22–5.81)
RDW: 13.6 % (ref 11.5–15.5)
WBC: 16.4 10*3/uL — ABNORMAL HIGH (ref 4.0–10.5)
nRBC: 0 % (ref 0.0–0.2)

## 2018-11-11 LAB — URINALYSIS, ROUTINE W REFLEX MICROSCOPIC
Bacteria, UA: NONE SEEN
Bilirubin Urine: NEGATIVE
Glucose, UA: NEGATIVE mg/dL
Ketones, ur: NEGATIVE mg/dL
Leukocytes,Ua: NEGATIVE
Nitrite: NEGATIVE
Protein, ur: NEGATIVE mg/dL
Specific Gravity, Urine: 1.017 (ref 1.005–1.030)
pH: 7 (ref 5.0–8.0)

## 2018-11-11 NOTE — Telephone Encounter (Signed)
Roger Gardner with [facility] called and stated that they took patient's Foley Cath out this morning around 8:00am. It was placed Friday for Urinary Retention.  Patient has NOT Voided. Nurse is wanting to know if it should be replaced. Please Advise.  Bloodwork and urine was done on 11/08/18 and results placed in [facility] folder for review.

## 2018-11-11 NOTE — ED Triage Notes (Signed)
Pt brought to ED from home via [facility] for abnormal lab values. Potassium on Friday 6.1.

## 2018-11-11 NOTE — Discharge Instructions (Signed)
Your kidney labs and potassium labs have returned to normal.  Discussed with your PCP regarding when to remove the urinary catheter.  I have listed the name for a urologist to call who can follow-up with you regarding this.  Emergency department with any new or worsening symptoms.

## 2018-11-11 NOTE — Telephone Encounter (Signed)
Elizabeth notified and agreed. Instructed her to call 911 and have patient go immediately.

## 2018-11-11 NOTE — Telephone Encounter (Signed)
Reviewed lab work, Cr is 7.26 he is in kidney failure and potassium 6.1 he needs to go to the hospital IMMEDIATELY.

## 2018-11-11 NOTE — ED Provider Notes (Signed)
Emergency Department Provider Note   I have reviewed the triage vital signs and the nursing notes.   HISTORY  Chief Complaint Abnormal Lab   HPI Roger Gardner is a 85 y.o. male with PMH of asbestosis and prior for lithiasis presents to the emergency department with lab work from Friday showing acute renal failure and hyperkalemia.  This was in the setting of urinary retention symptoms that the patient was experiencing on Friday.  He had a Foley catheter placed and family report large volume urine output throughout the weekend.  They remove the Foley catheter this morning but patient was unable to void and the catheter was reinserted.  Around this time, lab work from Friday resulted showing creatinine of greater than 7 and potassium of greater than 6.  He was called and instructed to present to the emergency department immediately.  Patient denies any lower abdominal pain, flank pain, or fever.  I spoke with the patient's wife and daughter by phone who provided additional history.  They deny any new medications or the patient complaining of other symptoms.    Past Medical History:  Diagnosis Date  . 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 Flu 1999   Hospitalized by Dr. Manuella Ghazi - [facility]    There are no active problems to display for this patient.   Past Surgical History:  Procedure Laterality Date  . HERNIA REPAIR    . KIDNEY STONE SURGERY     Basket extraction by Dr. Michela Pitcher     Allergies Codeine and Penicillins  Family History  Problem Relation Age of Onset  . Pneumonia Mother   . Cancer Brother   . Brain cancer Brother   . Alzheimer's disease Sister   . Breast cancer Sister     Social History Social History   Tobacco Use  . Smoking status: Former Smoker    Packs/day: 1.00    Years: 25.00    Pack years: 25.00    Types: Cigarettes    Last attempt to quit: 1975    Years since quitting: 45.3  . Smokeless tobacco: Never Used  Substance Use Topics  . Alcohol use:  Not Currently  . Drug use: Never    Review of Systems  Constitutional: No fever/chills Eyes: No visual changes. ENT: No sore throat. Cardiovascular: Denies chest pain. Respiratory: Denies shortness of breath. Gastrointestinal: No abdominal pain.  No nausea, no vomiting.  No diarrhea.  No constipation. Genitourinary: Positive urinary retention with foley in place.  Musculoskeletal: Negative for back pain. Skin: Negative for rash. Neurological: Negative for headaches, focal weakness or numbness.  10-point ROS otherwise negative.  ____________________________________________   PHYSICAL EXAM:  VITAL SIGNS: ED Triage Vitals  Enc Vitals Group     BP 11/11/18 1653 (!) 126/51     Pulse Rate 11/11/18 1653 86     Resp 11/11/18 1653 20     Temp 11/11/18 1653 98.1 F (36.7 C)     Temp Source 11/11/18 1653 Oral     SpO2 11/11/18 1653 97 %     Weight 11/11/18 1650 140 lb (63.5 kg)     Height 11/11/18 1650 5\' 6"  (1.676 m)     Pain Score 11/11/18 1650 0   Constitutional: Alert. Well appearing and in no acute distress. Eyes: Conjunctivae are normal.  Head: Atraumatic. Nose: No congestion/rhinnorhea. Mouth/Throat: Mucous membranes are moist. Neck: No stridor.   Cardiovascular: Normal rate, regular rhythm. Good peripheral circulation. Grossly normal heart sounds.  Respiratory: Normal respiratory effort.  No retractions. Lungs CTAB. Gastrointestinal: Soft and nontender. No distention.  Musculoskeletal: No lower extremity tenderness nor edema. No gross deformities of extremities. Neurologic:  Normal speech and language. No gross focal neurologic deficits are appreciated.  Skin:  Skin is warm, dry and intact. No rash noted.  ____________________________________________   LABS (all labs ordered are listed, but only abnormal results are displayed)  Labs Reviewed  COMPREHENSIVE METABOLIC PANEL - Abnormal; Notable for the following components:      Result Value   Glucose, Bld 122  (*)    BUN 30 (*)    Creatinine, Ser 1.29 (*)    Calcium 8.6 (*)    Albumin 3.1 (*)    GFR calc non Af Amer 47 (*)    GFR calc Af Amer 54 (*)    All other components within normal limits  CBC WITH DIFFERENTIAL/PLATELET - Abnormal; Notable for the following components:   WBC 16.4 (*)    Hemoglobin 11.9 (*)    Neutro Abs 11.7 (*)    Monocytes Absolute 1.2 (*)    Abs Immature Granulocytes 0.29 (*)    All other components within normal limits  URINALYSIS, ROUTINE W REFLEX MICROSCOPIC - Abnormal; Notable for the following components:   Hgb urine dipstick MODERATE (*)    All other components within normal limits  URINE CULTURE   ____________________________________________  EKG   EKG Interpretation  Date/Time:  Monday November 11 2018 17:17:53 EDT Ventricular Rate:  93 PR Interval:    QRS Duration: 94 QT Interval:  373 QTC Calculation: 464 R Axis:   -65 Text Interpretation:  Sinus rhythm Left anterior fascicular block No STEMI.  Reconfirmed by Nanda Quinton [phone]) on 11/11/2018 5:20:16 PM       ____________________________________________  RADIOLOGY  None  ____________________________________________   PROCEDURES  Procedure(s) performed:   Procedures  None  ____________________________________________   INITIAL IMPRESSION / ASSESSMENT AND PLAN / ED COURSE  Pertinent labs & imaging results that were available during my care of the patient were reviewed by me and considered in my medical decision making (see chart for details).   Patient arrives by EMS with concern for acute renal failure and hyperkalemia.  Lab work from Friday resulted this morning and patient was advised to present to the ED.  Given history, seems most consistent with a post renal, obstructive process.  Patient had to have his Foley catheter reinserted today.  Will repeat lab work to assess patient's current creatinine and potassium after Foley has now been in place for 2.5 days.  Kidney function  has returned to baseline on repeat labs here.  Potassium is normal.  Plan to continue Foley catheter.  Discussed this with the patient's daughter and wife by phone.  Provided contact information for urology for follow-up.  Patient does not experiencing any shortness of breath.  He has a oxygen saturation listed as 90% but on my evaluation he has been 97-100% on room air.  Suspect that this is an error.  Normal lung exam.  ____________________________________________  FINAL CLINICAL IMPRESSION(S) / ED DIAGNOSES  Final diagnoses:  AKI (acute kidney injury) ([place])  Urinary retention    Note:  This document was prepared using Dragon voice recognition software and may include unintentional dictation errors.  Nanda Quinton, MD Emergency Medicine    Veverly Larimer, [facility], MD 11/11/18 2149

## 2018-11-12 ENCOUNTER — Ambulatory Visit (INDEPENDENT_AMBULATORY_CARE_PROVIDER_SITE_OTHER): Payer: Medicare Other | Admitting: Nurse Practitioner

## 2018-11-12 ENCOUNTER — Encounter: Payer: Self-pay | Admitting: Nurse Practitioner

## 2018-11-12 DIAGNOSIS — R531 Weakness: Secondary | ICD-10-CM | POA: Diagnosis not present

## 2018-11-12 DIAGNOSIS — F0390 Unspecified dementia without behavioral disturbance: Secondary | ICD-10-CM | POA: Diagnosis not present

## 2018-11-12 DIAGNOSIS — R339 Retention of urine, unspecified: Secondary | ICD-10-CM

## 2018-11-12 NOTE — Progress Notes (Signed)
This service is provided via telemedicine  No vital signs collected/recorded due to the encounter was a telemedicine visit.   Location of patient (ex: home, work): Home   Patient consents to a telephone visit:  Yes  Location of the provider (ex: office, home):  [facility], Office   Name of any referring provider: N/A  Names of all persons participating in the telemedicine service and their role in the encounter: S.Chrae B/CMA, Sherrie Mustache, NP, Kelly(grandaughter in law) and Patient   Time spent on call:  10 min with medical assistant   Virtual Visit via Telephone Note  I connected with Roger Gardner on 11/12/18 at  3:15 PM EDT by telephone and verified that I am speaking with the correct person using two identifiers.   I discussed the limitations, risks, security and privacy concerns of performing an evaluation and management service by telephone and the availability of in person appointments. I also discussed with the patient that there may be a patient responsible charge related to this service. The patient expressed understanding and agreed to proceed.      Careteam: Patient Care Team: [name], NP as PCP - General (Geriatric Medicine)  Advanced Directive information    Allergies  Allergen Reactions  . Codeine Other (See Comments)    Altered mental status  . Penicillins     Did it involve swelling of the face/tongue/throat, SOB, or low BP? Unknown Did it involve sudden or severe rash/hives, skin peeling, or any reaction on the inside of your mouth or nose? Unknown Did you need to seek medical attention at a hospital or doctor's office? Unknown When did it last happen? 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     Chief Complaint  Patient presents with  . Follow-up    ER follow-up,  acute kidney injury. Moderate fall risk (falls about 2 x weekly). Discuss orders for Home Health and PT. Tele-visit  . Medication Management    Patient's wife d/c'ed Namenda and Myrbetriq because she does not like to give him medications.      HPI: Patient is a 85 y.o. male for follow up after ED visit.   Question about home health and PT- home health did an evaluation and they have now signed off per caregiver. [facility] were coming out.  pts wife is 46 and gets confused over care and the plan.  Pt currently has an indwelling catheter after ED visit.  Pt with hx of BPH and has had a hx of urinary problems. He was previously on medication but "wife does not believe in medication" and they stopped giving him medication.   Reports pt is doing so much better than what he was. Walking better, feeling better, skin color looks better. Caregiver reports they saw stones in the bag and felt like that could have been a lot of the problem.  Currently catheter draining well, clear, yellow urine. No fever or increase confusion (less confused now)   Review of Systems:  Review of Systems  Constitutional: Negative for chills, fever and malaise/fatigue.  Respiratory: Negative for cough and shortness of breath.   Cardiovascular: Negative for chest pain, claudication and leg swelling.  Gastrointestinal: Negative for abdominal pain, constipation, diarrhea, heartburn, nausea and vomiting.  Genitourinary: Negative for dysuria, frequency and urgency.       Foley catheter  Musculoskeletal: Positive for falls. Negative for myalgias.  Neurological: Negative for weakness.  Psychiatric/Behavioral: Positive for memory loss. Negative for depression. The patient is not nervous/anxious and does not have insomnia.     Past Medical History:  Diagnosis Date  . 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 Flu 1999   Hospitalized by [facility]. [facility]   Past Surgical History:  Procedure Laterality Date  . HERNIA  REPAIR    . KIDNEY STONE SURGERY     Basket extraction by Dr. Michela Pitcher    Social History:   reports that he quit smoking about 45 years ago. His smoking use included cigarettes. He has a 25.00 pack-year smoking history. He has never used smokeless tobacco. He reports previous alcohol use. He reports that he does not use drugs.  Family History  Problem Relation Age of Onset  . Pneumonia Mother   . Cancer Brother   . Brain cancer Brother   . Alzheimer's disease Sister   . Breast cancer Sister     Medications: Patient's Medications  New Prescriptions   No medications on file  Previous Medications   DIPHENHYDRAMINE (BENADRYL) 50 MG TABLET    Take 50 mg by mouth as needed for allergies.   IBUPROFEN (ADVIL) 200 MG TABLET    Take 200 mg by mouth as needed.   LORATADINE (CLARITIN) 10 MG TABLET    Take 10 mg by mouth as needed for allergies.  Modified Medications   No medications on file  Discontinued Medications   MEMANTINE (NAMENDA TITRATION PAK) TABLET PACK    5 mg/day for =1 week; 5 mg twice daily for =1 week; 15 mg/day given in 5 mg and 10 mg separated doses for =1 week; then 10 mg twice daily   MIRABEGRON ER (MYRBETRIQ) 25 MG TB24 TABLET    Take 1 tablet (25 mg total) by mouth daily.   SPS 15 GM/60ML SUSPENSION    Take 15 g by mouth once.      Physical Exam:  Labs reviewed: Basic Metabolic Panel: Recent Labs    09/27/18 1447 11/08/18 11/11/18 1714  NA 139 140 143  K 4.7 6.1* 4.2  CL 101  --  109  CO2 29  --  27  GLUCOSE 83  --  122*  BUN 22 85* 30*  CREATININE 1.21* 7.3* 1.29*  CALCIUM 9.4  --  8.6*   Liver Function Tests: Recent Labs    09/27/18 1447 11/11/18 1714  AST 19 22  ALT 12 23  ALKPHOS  --  64  BILITOT 1.1 0.5  PROT 7.2 6.9  ALBUMIN  --  3.1*   No results for input(s): LIPASE, AMYLASE in the last 8760 hours. No results for input(s): AMMONIA in the last 8760 hours. CBC: Recent Labs    09/27/18 1447 11/08/18 11/11/18 1714  WBC 10.6 14.4 16.4*   NEUTROABS 6,837  --  11.7*  HGB 14.3 12.8* 11.9*  HCT 43.8 41 39.6  MCV 87.4  --  93.6  PLT 263 174 260   Lipid Panel: No results for input(s): CHOL, HDL, LDLCALC, TRIG, CHOLHDL, LDLDIRECT in the last 8760 hours. TSH: No results for input(s): TSH in the last 8760 hours. A1C: No results found for: HGBA1C   Assessment/Plan 1. Urinary retention -clear yellow urine current draining from catheter.  -will contact home health nursing since he now has catheter.  - Ambulatory referral to Urology  2. Generalized weakness -improved since catheter was placed and urine is  now coming out.   3. Dementia without behavioral disturbance, unspecified dementia type ([place]) Ongoing, caregiver reporting wife needs more in home health. Home health has been coming out but will be signing off (if have not already). Encouraged to look into private in home care. Will send information regarding this.   [name]. Harle Battiest  [facility] [phone]    Follow Up Instructions:    I discussed the assessment and treatment plan with the patient. The patient was provided an opportunity to ask questions and all were answered. The patient agreed with the plan and demonstrated an understanding of the instructions.   The patient was advised to call back or seek an in-person evaluation if the symptoms worsen or if the condition fails to improve as anticipated.  I provided 8 minutes of non-face-to-face time during this encounter.   [name], NP

## 2018-11-13 ENCOUNTER — Telehealth: Payer: Self-pay | Admitting: *Deleted

## 2018-11-13 DIAGNOSIS — J302 Other seasonal allergic rhinitis: Secondary | ICD-10-CM | POA: Diagnosis not present

## 2018-11-13 DIAGNOSIS — R339 Retention of urine, unspecified: Secondary | ICD-10-CM | POA: Diagnosis not present

## 2018-11-13 DIAGNOSIS — R296 Repeated falls: Secondary | ICD-10-CM | POA: Diagnosis not present

## 2018-11-13 DIAGNOSIS — F0391 Unspecified dementia with behavioral disturbance: Secondary | ICD-10-CM | POA: Diagnosis not present

## 2018-11-13 DIAGNOSIS — Z87891 Personal history of nicotine dependence: Secondary | ICD-10-CM | POA: Diagnosis not present

## 2018-11-13 DIAGNOSIS — M6281 Muscle weakness (generalized): Secondary | ICD-10-CM | POA: Diagnosis not present

## 2018-11-13 LAB — URINE CULTURE
Culture: NO GROWTH
Special Requests: NORMAL

## 2018-11-13 NOTE — Telephone Encounter (Signed)
Kathlee Nations with [facility] called requesting orders to manage patient's Cath. If issues arise. Stated that he has an appointment with [facility] on Friday and they will take over then but if something comes up between now and then she wants orders to be able to manage. Please Advise.

## 2018-11-13 NOTE — Telephone Encounter (Signed)
Kathlee Nations notified and agreed.

## 2018-11-13 NOTE — Telephone Encounter (Signed)
Yes this is okay 

## 2018-11-14 DIAGNOSIS — R339 Retention of urine, unspecified: Secondary | ICD-10-CM | POA: Diagnosis not present

## 2018-11-14 DIAGNOSIS — J302 Other seasonal allergic rhinitis: Secondary | ICD-10-CM | POA: Diagnosis not present

## 2018-11-14 DIAGNOSIS — Z87891 Personal history of nicotine dependence: Secondary | ICD-10-CM | POA: Diagnosis not present

## 2018-11-14 DIAGNOSIS — M6281 Muscle weakness (generalized): Secondary | ICD-10-CM | POA: Diagnosis not present

## 2018-11-14 DIAGNOSIS — R296 Repeated falls: Secondary | ICD-10-CM | POA: Diagnosis not present

## 2018-11-14 DIAGNOSIS — F0391 Unspecified dementia with behavioral disturbance: Secondary | ICD-10-CM | POA: Diagnosis not present

## 2018-11-15 DIAGNOSIS — J302 Other seasonal allergic rhinitis: Secondary | ICD-10-CM | POA: Diagnosis not present

## 2018-11-15 DIAGNOSIS — R296 Repeated falls: Secondary | ICD-10-CM | POA: Diagnosis not present

## 2018-11-15 DIAGNOSIS — N200 Calculus of kidney: Secondary | ICD-10-CM | POA: Diagnosis not present

## 2018-11-15 DIAGNOSIS — M6281 Muscle weakness (generalized): Secondary | ICD-10-CM | POA: Diagnosis not present

## 2018-11-15 DIAGNOSIS — N1330 Unspecified hydronephrosis: Secondary | ICD-10-CM | POA: Diagnosis not present

## 2018-11-15 DIAGNOSIS — R3914 Feeling of incomplete bladder emptying: Secondary | ICD-10-CM | POA: Diagnosis not present

## 2018-11-15 DIAGNOSIS — R3912 Poor urinary stream: Secondary | ICD-10-CM | POA: Diagnosis not present

## 2018-11-15 DIAGNOSIS — Z87891 Personal history of nicotine dependence: Secondary | ICD-10-CM | POA: Diagnosis not present

## 2018-11-15 DIAGNOSIS — N130 Hydronephrosis with ureteropelvic junction obstruction: Secondary | ICD-10-CM | POA: Diagnosis not present

## 2018-11-15 DIAGNOSIS — R339 Retention of urine, unspecified: Secondary | ICD-10-CM | POA: Diagnosis not present

## 2018-11-15 DIAGNOSIS — R350 Frequency of micturition: Secondary | ICD-10-CM | POA: Diagnosis not present

## 2018-11-15 DIAGNOSIS — F0391 Unspecified dementia with behavioral disturbance: Secondary | ICD-10-CM | POA: Diagnosis not present

## 2018-11-18 DIAGNOSIS — Z87891 Personal history of nicotine dependence: Secondary | ICD-10-CM | POA: Diagnosis not present

## 2018-11-18 DIAGNOSIS — R296 Repeated falls: Secondary | ICD-10-CM | POA: Diagnosis not present

## 2018-11-18 DIAGNOSIS — R3914 Feeling of incomplete bladder emptying: Secondary | ICD-10-CM | POA: Diagnosis not present

## 2018-11-18 DIAGNOSIS — M6281 Muscle weakness (generalized): Secondary | ICD-10-CM | POA: Diagnosis not present

## 2018-11-18 DIAGNOSIS — F0391 Unspecified dementia with behavioral disturbance: Secondary | ICD-10-CM | POA: Diagnosis not present

## 2018-11-18 DIAGNOSIS — J302 Other seasonal allergic rhinitis: Secondary | ICD-10-CM | POA: Diagnosis not present

## 2018-11-18 DIAGNOSIS — N130 Hydronephrosis with ureteropelvic junction obstruction: Secondary | ICD-10-CM | POA: Diagnosis not present

## 2018-11-18 DIAGNOSIS — R350 Frequency of micturition: Secondary | ICD-10-CM | POA: Diagnosis not present

## 2018-11-18 DIAGNOSIS — R339 Retention of urine, unspecified: Secondary | ICD-10-CM | POA: Diagnosis not present

## 2018-11-19 DIAGNOSIS — Z87891 Personal history of nicotine dependence: Secondary | ICD-10-CM | POA: Diagnosis not present

## 2018-11-19 DIAGNOSIS — M6281 Muscle weakness (generalized): Secondary | ICD-10-CM | POA: Diagnosis not present

## 2018-11-19 DIAGNOSIS — R296 Repeated falls: Secondary | ICD-10-CM | POA: Diagnosis not present

## 2018-11-19 DIAGNOSIS — R339 Retention of urine, unspecified: Secondary | ICD-10-CM | POA: Diagnosis not present

## 2018-11-19 DIAGNOSIS — F0391 Unspecified dementia with behavioral disturbance: Secondary | ICD-10-CM | POA: Diagnosis not present

## 2018-11-19 DIAGNOSIS — J302 Other seasonal allergic rhinitis: Secondary | ICD-10-CM | POA: Diagnosis not present

## 2018-11-20 DIAGNOSIS — Z87891 Personal history of nicotine dependence: Secondary | ICD-10-CM | POA: Diagnosis not present

## 2018-11-20 DIAGNOSIS — F0391 Unspecified dementia with behavioral disturbance: Secondary | ICD-10-CM | POA: Diagnosis not present

## 2018-11-20 DIAGNOSIS — R339 Retention of urine, unspecified: Secondary | ICD-10-CM | POA: Diagnosis not present

## 2018-11-20 DIAGNOSIS — J302 Other seasonal allergic rhinitis: Secondary | ICD-10-CM | POA: Diagnosis not present

## 2018-11-20 DIAGNOSIS — M6281 Muscle weakness (generalized): Secondary | ICD-10-CM | POA: Diagnosis not present

## 2018-11-20 DIAGNOSIS — R296 Repeated falls: Secondary | ICD-10-CM | POA: Diagnosis not present

## 2018-11-22 DIAGNOSIS — J302 Other seasonal allergic rhinitis: Secondary | ICD-10-CM | POA: Diagnosis not present

## 2018-11-22 DIAGNOSIS — R339 Retention of urine, unspecified: Secondary | ICD-10-CM | POA: Diagnosis not present

## 2018-11-22 DIAGNOSIS — R296 Repeated falls: Secondary | ICD-10-CM | POA: Diagnosis not present

## 2018-11-22 DIAGNOSIS — M6281 Muscle weakness (generalized): Secondary | ICD-10-CM | POA: Diagnosis not present

## 2018-11-22 DIAGNOSIS — Z87891 Personal history of nicotine dependence: Secondary | ICD-10-CM | POA: Diagnosis not present

## 2018-11-22 DIAGNOSIS — F0391 Unspecified dementia with behavioral disturbance: Secondary | ICD-10-CM | POA: Diagnosis not present

## 2018-11-25 DIAGNOSIS — R339 Retention of urine, unspecified: Secondary | ICD-10-CM | POA: Diagnosis not present

## 2018-11-25 DIAGNOSIS — Z87891 Personal history of nicotine dependence: Secondary | ICD-10-CM | POA: Diagnosis not present

## 2018-11-25 DIAGNOSIS — M6281 Muscle weakness (generalized): Secondary | ICD-10-CM | POA: Diagnosis not present

## 2018-11-25 DIAGNOSIS — J302 Other seasonal allergic rhinitis: Secondary | ICD-10-CM | POA: Diagnosis not present

## 2018-11-25 DIAGNOSIS — F0391 Unspecified dementia with behavioral disturbance: Secondary | ICD-10-CM | POA: Diagnosis not present

## 2018-11-25 DIAGNOSIS — R296 Repeated falls: Secondary | ICD-10-CM | POA: Diagnosis not present

## 2018-11-26 DIAGNOSIS — R3914 Feeling of incomplete bladder emptying: Secondary | ICD-10-CM | POA: Diagnosis not present

## 2018-11-26 DIAGNOSIS — R3912 Poor urinary stream: Secondary | ICD-10-CM | POA: Diagnosis not present

## 2018-11-27 DIAGNOSIS — M6281 Muscle weakness (generalized): Secondary | ICD-10-CM | POA: Diagnosis not present

## 2018-11-27 DIAGNOSIS — R339 Retention of urine, unspecified: Secondary | ICD-10-CM | POA: Diagnosis not present

## 2018-11-27 DIAGNOSIS — R3914 Feeling of incomplete bladder emptying: Secondary | ICD-10-CM | POA: Diagnosis not present

## 2018-11-27 DIAGNOSIS — Z87891 Personal history of nicotine dependence: Secondary | ICD-10-CM | POA: Diagnosis not present

## 2018-11-27 DIAGNOSIS — R296 Repeated falls: Secondary | ICD-10-CM | POA: Diagnosis not present

## 2018-11-27 DIAGNOSIS — F0391 Unspecified dementia with behavioral disturbance: Secondary | ICD-10-CM | POA: Diagnosis not present

## 2018-11-27 DIAGNOSIS — J302 Other seasonal allergic rhinitis: Secondary | ICD-10-CM | POA: Diagnosis not present

## 2018-11-28 DIAGNOSIS — R339 Retention of urine, unspecified: Secondary | ICD-10-CM | POA: Diagnosis not present

## 2018-11-28 DIAGNOSIS — J302 Other seasonal allergic rhinitis: Secondary | ICD-10-CM | POA: Diagnosis not present

## 2018-11-28 DIAGNOSIS — M6281 Muscle weakness (generalized): Secondary | ICD-10-CM | POA: Diagnosis not present

## 2018-11-28 DIAGNOSIS — Z87891 Personal history of nicotine dependence: Secondary | ICD-10-CM | POA: Diagnosis not present

## 2018-11-28 DIAGNOSIS — F0391 Unspecified dementia with behavioral disturbance: Secondary | ICD-10-CM | POA: Diagnosis not present

## 2018-11-28 DIAGNOSIS — R296 Repeated falls: Secondary | ICD-10-CM | POA: Diagnosis not present

## 2018-11-29 DIAGNOSIS — F0391 Unspecified dementia with behavioral disturbance: Secondary | ICD-10-CM | POA: Diagnosis not present

## 2018-11-29 DIAGNOSIS — R339 Retention of urine, unspecified: Secondary | ICD-10-CM | POA: Diagnosis not present

## 2018-11-29 DIAGNOSIS — J302 Other seasonal allergic rhinitis: Secondary | ICD-10-CM | POA: Diagnosis not present

## 2018-11-29 DIAGNOSIS — Z87891 Personal history of nicotine dependence: Secondary | ICD-10-CM | POA: Diagnosis not present

## 2018-11-29 DIAGNOSIS — R296 Repeated falls: Secondary | ICD-10-CM | POA: Diagnosis not present

## 2018-11-29 DIAGNOSIS — M6281 Muscle weakness (generalized): Secondary | ICD-10-CM | POA: Diagnosis not present

## 2018-12-02 DIAGNOSIS — F0391 Unspecified dementia with behavioral disturbance: Secondary | ICD-10-CM | POA: Diagnosis not present

## 2018-12-02 DIAGNOSIS — R296 Repeated falls: Secondary | ICD-10-CM | POA: Diagnosis not present

## 2018-12-02 DIAGNOSIS — M6281 Muscle weakness (generalized): Secondary | ICD-10-CM | POA: Diagnosis not present

## 2018-12-02 DIAGNOSIS — J302 Other seasonal allergic rhinitis: Secondary | ICD-10-CM | POA: Diagnosis not present

## 2018-12-02 DIAGNOSIS — R339 Retention of urine, unspecified: Secondary | ICD-10-CM | POA: Diagnosis not present

## 2018-12-02 DIAGNOSIS — Z87891 Personal history of nicotine dependence: Secondary | ICD-10-CM | POA: Diagnosis not present

## 2018-12-03 DIAGNOSIS — J302 Other seasonal allergic rhinitis: Secondary | ICD-10-CM | POA: Diagnosis not present

## 2018-12-03 DIAGNOSIS — F0391 Unspecified dementia with behavioral disturbance: Secondary | ICD-10-CM | POA: Diagnosis not present

## 2018-12-03 DIAGNOSIS — R339 Retention of urine, unspecified: Secondary | ICD-10-CM | POA: Diagnosis not present

## 2018-12-03 DIAGNOSIS — M6281 Muscle weakness (generalized): Secondary | ICD-10-CM | POA: Diagnosis not present

## 2018-12-03 DIAGNOSIS — Z87891 Personal history of nicotine dependence: Secondary | ICD-10-CM | POA: Diagnosis not present

## 2018-12-03 DIAGNOSIS — R296 Repeated falls: Secondary | ICD-10-CM | POA: Diagnosis not present

## 2018-12-06 DIAGNOSIS — R296 Repeated falls: Secondary | ICD-10-CM | POA: Diagnosis not present

## 2018-12-06 DIAGNOSIS — F0391 Unspecified dementia with behavioral disturbance: Secondary | ICD-10-CM | POA: Diagnosis not present

## 2018-12-06 DIAGNOSIS — J302 Other seasonal allergic rhinitis: Secondary | ICD-10-CM | POA: Diagnosis not present

## 2018-12-06 DIAGNOSIS — Z87891 Personal history of nicotine dependence: Secondary | ICD-10-CM | POA: Diagnosis not present

## 2018-12-06 DIAGNOSIS — M6281 Muscle weakness (generalized): Secondary | ICD-10-CM | POA: Diagnosis not present

## 2018-12-06 DIAGNOSIS — Z743 Need for continuous supervision: Secondary | ICD-10-CM | POA: Diagnosis not present

## 2018-12-06 DIAGNOSIS — R41841 Cognitive communication deficit: Secondary | ICD-10-CM | POA: Diagnosis not present

## 2018-12-06 DIAGNOSIS — R339 Retention of urine, unspecified: Secondary | ICD-10-CM | POA: Diagnosis not present

## 2018-12-10 DIAGNOSIS — Z87891 Personal history of nicotine dependence: Secondary | ICD-10-CM | POA: Diagnosis not present

## 2018-12-10 DIAGNOSIS — F0391 Unspecified dementia with behavioral disturbance: Secondary | ICD-10-CM | POA: Diagnosis not present

## 2018-12-10 DIAGNOSIS — R339 Retention of urine, unspecified: Secondary | ICD-10-CM | POA: Diagnosis not present

## 2018-12-10 DIAGNOSIS — R296 Repeated falls: Secondary | ICD-10-CM | POA: Diagnosis not present

## 2018-12-10 DIAGNOSIS — M6281 Muscle weakness (generalized): Secondary | ICD-10-CM | POA: Diagnosis not present

## 2018-12-10 DIAGNOSIS — J302 Other seasonal allergic rhinitis: Secondary | ICD-10-CM | POA: Diagnosis not present

## 2018-12-17 DIAGNOSIS — J302 Other seasonal allergic rhinitis: Secondary | ICD-10-CM | POA: Diagnosis not present

## 2018-12-17 DIAGNOSIS — M6281 Muscle weakness (generalized): Secondary | ICD-10-CM | POA: Diagnosis not present

## 2018-12-17 DIAGNOSIS — F0391 Unspecified dementia with behavioral disturbance: Secondary | ICD-10-CM | POA: Diagnosis not present

## 2018-12-17 DIAGNOSIS — R296 Repeated falls: Secondary | ICD-10-CM | POA: Diagnosis not present

## 2018-12-17 DIAGNOSIS — R339 Retention of urine, unspecified: Secondary | ICD-10-CM | POA: Diagnosis not present

## 2018-12-17 DIAGNOSIS — Z87891 Personal history of nicotine dependence: Secondary | ICD-10-CM | POA: Diagnosis not present

## 2018-12-18 ENCOUNTER — Telehealth: Payer: Self-pay | Admitting: *Deleted

## 2018-12-18 DIAGNOSIS — Z87891 Personal history of nicotine dependence: Secondary | ICD-10-CM | POA: Diagnosis not present

## 2018-12-18 DIAGNOSIS — R339 Retention of urine, unspecified: Secondary | ICD-10-CM | POA: Diagnosis not present

## 2018-12-18 DIAGNOSIS — M6281 Muscle weakness (generalized): Secondary | ICD-10-CM | POA: Diagnosis not present

## 2018-12-18 DIAGNOSIS — R296 Repeated falls: Secondary | ICD-10-CM | POA: Diagnosis not present

## 2018-12-18 DIAGNOSIS — J302 Other seasonal allergic rhinitis: Secondary | ICD-10-CM | POA: Diagnosis not present

## 2018-12-18 DIAGNOSIS — F0391 Unspecified dementia with behavioral disturbance: Secondary | ICD-10-CM | POA: Diagnosis not present

## 2018-12-18 NOTE — Telephone Encounter (Signed)
Pt with hx of behaviors but family did not wasn't to give him medication in the past, we can schedule a telephone visit or visit in office to discuss if needed.

## 2018-12-18 NOTE — Telephone Encounter (Signed)
Spoke with daughter. TeleVisit Scheduled with Dinah for Friday.

## 2018-12-18 NOTE — Telephone Encounter (Signed)
Roger Gardner with [facility] called and stated that the wife stated patient has been very aggressive and trying to pull out his Cath. Nurse is wanting to know if there is something you can call in as needed to calm him down. Please Advise.

## 2018-12-20 ENCOUNTER — Other Ambulatory Visit: Payer: Self-pay

## 2018-12-20 ENCOUNTER — Encounter: Payer: Medicare Other | Admitting: Family

## 2018-12-20 ENCOUNTER — Encounter: Payer: Self-pay | Admitting: Family

## 2018-12-20 NOTE — Progress Notes (Signed)
This service is provided via telemedicine  No vital signs collected/recorded due to the encounter was a telemedicine visit.   Location of patient (ex: home, work):  Home   Patient consents to a telephone visit:  Yes  Location of the provider (ex: office, home): office   Name of any referring provider:  Sherrie Mustache NP   Names of all persons participating in the telemedicine service and their role in the encounter:  Ruthell Rummage CMA, [place] Dorna Mallet NP, Lacey Jensen and Roger Gardner his wife  Time spent on call:  Ruthell Rummage CMA spent  7 Minutes on phone with patient  .  Provider: Emma-Lee Oddo FNP-C  [name], NP  Patient Care Team: [name], NP as PCP - General (Geriatric Medicine)  Extended Emergency Contact Information Primary Emergency Contact: Gardner,Roger Address: [place],  [place] Home Phone: [phone] Relation: None   Goals of care: Advanced Directive information Advanced Directives 11/11/2018  Does Patient Have a Medical Advance Directive? Yes  Type of Advance Directive [place]  Does patient want to make changes to medical advance directive? -  Copy of [place] in Chart? Yes - validated most recent copy scanned in chart (See row information)     Chief Complaint  Patient presents with  . Acute Visit    Patient has became more aggressive with wife duration of 2 weeks     HPI:  Pt is a 85 y.o. male seen    Past Medical History:  Diagnosis Date  . 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 Flu 1999   Hospitalized by Dr. Lenord [facility]   Past Surgical History:  Procedure Laterality Date  . HERNIA REPAIR    . KIDNEY STONE SURGERY     Basket extraction by Dr. Michela Pitcher     Allergies  Allergen Reactions  . Codeine Other (See Comments)    Altered mental status  . Penicillins     Did it involve swelling of the face/tongue/throat, SOB, or low BP? Unknown Did it involve  sudden or severe rash/hives, skin peeling, or any reaction on the inside of your mouth or nose? Unknown Did you need to seek medical attention at a hospital or doctor's office? Unknown When did it last happen? 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     Outpatient Encounter Medications as of 12/20/2018  Medication Sig  . diphenhydrAMINE (BENADRYL) 50 MG tablet Take 50 mg by mouth as needed for allergies.  Marland Kitchen ibuprofen (ADVIL) 200 MG tablet Take 200 mg by mouth as needed.  . loratadine (CLARITIN) 10 MG tablet Take 10 mg by mouth as needed for allergies.   No facility-administered encounter medications on file as of 12/20/2018.      There is no immunization history on file for this patient. Pertinent  Health Maintenance Due  Topic Date Due  . PNA vac Low Risk Adult (1 of 2 - PCV13) 09/27/2019 (Originally [birth year]/1988)  . INFLUENZA VACCINE  02/22/2019   Fall Risk  12/20/2018 11/12/2018 11/08/2018 11/04/2018 09/27/2018  Falls in the past year? 0 1 1 1 1   Number falls in past yr: 0 1 0 1 1  Injury with Fall? 0 0 1 0 0  Risk for fall due to : -  History of fall(s) - History of fall(s);Impaired balance/gait History of fall(s)    There were no vitals filed for this visit. There is no height or weight on file to calculate BMI. Physical Exam  Labs reviewed: Recent Labs    09/27/18 1447 11/08/18 11/11/18 1714  NA 139 140 143  K 4.7 6.1* 4.2  CL 101  --  109  CO2 29  --  27  GLUCOSE 83  --  122*  BUN 22 85* 30*  CREATININE 1.21* 7.3* 1.29*  CALCIUM 9.4  --  8.6*   Recent Labs    09/27/18 1447 11/11/18 1714  AST 19 22  ALT 12 23  ALKPHOS  --  64  BILITOT 1.1 0.5  PROT 7.2 6.9  ALBUMIN  --  3.1*   Recent Labs    09/27/18 1447 11/08/18 11/11/18 1714  WBC 10.6 14.4 16.4*  NEUTROABS 6,837  --  11.7*  HGB 14.3 12.8* 11.9*  HCT  43.8 41 39.6  MCV 87.4  --  93.6  PLT 263 174 260    Significant Diagnostic Results in last 30 days:  No results found.  Assessment/Plan There are no diagnoses linked to this encounter.   Family/ staff Communication:  Labs/tests ordered: None   Kyliah Deanda C Kesa Birky, NP  This encounter was created in error - please disregard. This encounter was created in error - please disregard.

## 2018-12-24 ENCOUNTER — Encounter: Payer: Self-pay | Admitting: Nurse Practitioner

## 2018-12-24 ENCOUNTER — Other Ambulatory Visit: Payer: Self-pay

## 2018-12-24 ENCOUNTER — Ambulatory Visit (INDEPENDENT_AMBULATORY_CARE_PROVIDER_SITE_OTHER): Payer: Medicare Other | Admitting: Nurse Practitioner

## 2018-12-24 ENCOUNTER — Telehealth: Payer: Self-pay | Admitting: Nurse Practitioner

## 2018-12-24 VITALS — BP 138/70 | HR 75 | Temp 98.1°F | Ht 66.0 in | Wt 155.8 lb

## 2018-12-24 DIAGNOSIS — R2241 Localized swelling, mass and lump, right lower limb: Secondary | ICD-10-CM | POA: Diagnosis not present

## 2018-12-24 DIAGNOSIS — Z87891 Personal history of nicotine dependence: Secondary | ICD-10-CM | POA: Diagnosis not present

## 2018-12-24 DIAGNOSIS — J302 Other seasonal allergic rhinitis: Secondary | ICD-10-CM | POA: Diagnosis not present

## 2018-12-24 DIAGNOSIS — F0391 Unspecified dementia with behavioral disturbance: Secondary | ICD-10-CM | POA: Diagnosis not present

## 2018-12-24 DIAGNOSIS — R296 Repeated falls: Secondary | ICD-10-CM | POA: Diagnosis not present

## 2018-12-24 DIAGNOSIS — R339 Retention of urine, unspecified: Secondary | ICD-10-CM | POA: Diagnosis not present

## 2018-12-24 DIAGNOSIS — M6281 Muscle weakness (generalized): Secondary | ICD-10-CM | POA: Diagnosis not present

## 2018-12-24 NOTE — Patient Instructions (Signed)
We are going to order an ultrasound of the leg to rule out a blood clot, you should be hearing from our office soon in regards to where this will be done.

## 2018-12-24 NOTE — Progress Notes (Signed)
Careteam: Patient Care Team: Roger Chandler, NP as PCP - General (Geriatric Medicine)  Advanced Directive information    Allergies  Allergen Reactions  . Codeine Other (See Comments)    Altered mental status  . Penicillins     Did it involve swelling of the face/tongue/throat, SOB, or low BP? Unknown Did it involve sudden or severe rash/hives, skin peeling, or any reaction on the inside of your mouth or nose? Unknown Did you need to seek medical attention at a hospital or doctor's office? Unknown When did it last happen? 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     Chief Complaint  Patient presents with  . Acute Visit    Right leg and foot swelling x couple of months. Moderate fall risk. Here with daughter Roger Gardner      HPI: Patient is a 85 y.o. male seen in the office today due to swelling to right leg.  Daughter here with him and reports swelling since he has had his foley catheter and problems with urinary retention.  Reports both legs were swollen but now only right leg.  Yesterday leg was ice cold. Today slightly warm compared to other.  Some redness/pink color.  No wound.  No shortness of breath or chest pain.  Does not get more swollen thought out the day but feels like overall swelling is getting worse.  Has foley catheter and leg strap bothers him, daughter states it gets too tight.   Review of Systems:  Review of Systems  Constitutional: Negative for chills and fever.  Respiratory: Negative for sputum production and shortness of breath.   Cardiovascular: Positive for leg swelling. Negative for chest pain.  Musculoskeletal: Negative for falls and joint pain.  Skin: Negative for itching and rash.  Psychiatric/Behavioral: Positive for memory loss.    Past Medical History:  Diagnosis Date  .  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 Flu 1999   Hospitalized by Dr. Lenord [facility]   Past Surgical History:  Procedure Laterality Date  . HERNIA REPAIR    . KIDNEY STONE SURGERY     Basket extraction by Dr. Michela Pitcher    Social History:   reports that he quit smoking about 45 years ago. His smoking use included cigarettes. He has a 25.00 pack-year smoking history. He has never used smokeless tobacco. He reports previous alcohol use. He reports that he does not use drugs.  Family History  Problem Relation Age of Onset  . Pneumonia Mother   . Cancer Brother   . Brain cancer Brother   . Alzheimer's disease Sister   . Breast cancer Sister     Medications: Patient's Medications  New Prescriptions   No medications on file  Previous Medications   DIPHENHYDRAMINE (BENADRYL) 50 MG TABLET    Take 50 mg by mouth as needed for allergies.   IBUPROFEN (ADVIL) 200 MG TABLET    Take 200 mg by mouth as needed.   LORATADINE (CLARITIN) 10 MG TABLET    Take 10 mg by mouth as needed for allergies.  Modified Medications   No medications on file  Discontinued Medications   No medications on file    Physical Exam:  Vitals:   12/24/18 1018  BP: 138/70  Pulse: 75  Temp: 98.1 F (36.7 C)  TempSrc: Oral  SpO2: 98%  Weight:  155 lb 12.8 oz (70.7 kg)  Height: 5\' 6"  (1.676 m)   Body mass index is 25.15 kg/m. Wt Readings from Last 3 Encounters:  12/24/18 155 lb 12.8 oz (70.7 kg)  11/11/18 140 lb (63.5 kg)  09/27/18 154 lb (69.9 kg)    Physical Exam Constitutional:      General: He is not in acute distress.    Appearance: Normal appearance. He is well-developed. He is not diaphoretic.     Comments: Frail thin built elderly man  HENT:     Head: Normocephalic and atraumatic.     Nose: Nose normal.     Mouth/Throat:     Mouth: Mucous membranes are moist.     Pharynx: No oropharyngeal exudate.  Eyes:     Conjunctiva/sclera: Conjunctivae normal.     Pupils: Pupils are equal, round, and  reactive to light.  Neck:     Musculoskeletal: Normal range of motion and neck supple.  Cardiovascular:     Rate and Rhythm: Normal rate and regular rhythm.     Heart sounds: Normal heart sounds.  Pulmonary:     Effort: Pulmonary effort is normal.     Breath sounds: Normal breath sounds.  Abdominal:     General: Bowel sounds are normal.     Palpations: Abdomen is soft.  Musculoskeletal:     Right lower leg: He exhibits tenderness and swelling. Edema present.     Comments: Right lower leg with 2+ edema, tender to calf on exam. Slight redness and warmth noted compared to left.   Skin:    General: Skin is warm and dry.     Findings: Lesion (multiple lesions to hands, face and bleeding larger lesion on left arm (suspected cancer, nonhealing) has gone to dermatology but does not wish to have further follow up) present.  Neurological:     Mental Status: He is alert and oriented to person, place, and time.     Labs reviewed: Basic Metabolic Panel: Recent Labs    09/27/18 1447 11/08/18 11/11/18 1714  NA 139 140 143  K 4.7 6.1* 4.2  CL 101  --  109  CO2 29  --  27  GLUCOSE 83  --  122*  BUN 22 85* 30*  CREATININE 1.21* 7.3* 1.29*  CALCIUM 9.4  --  8.6*   Liver Function Tests: Recent Labs    09/27/18 1447 11/11/18 1714  AST 19 22  ALT 12 23  ALKPHOS  --  64  BILITOT 1.1 0.5  PROT 7.2 6.9  ALBUMIN  --  3.1*   No results for input(s): LIPASE, AMYLASE in the last 8760 hours. No results for input(s): AMMONIA in the last 8760 hours. CBC: Recent Labs    09/27/18 1447 11/08/18 11/11/18 1714  WBC 10.6 14.4 16.4*  NEUTROABS 6,837  --  11.7*  HGB 14.3 12.8* 11.9*  HCT 43.8 41 39.6  MCV 87.4  --  93.6  PLT 263 174 260   Lipid Panel: No results for input(s): CHOL, HDL, LDLCALC, TRIG, CHOLHDL, LDLDIRECT in the last 8760 hours. TSH: No results for input(s): TSH in the last 8760 hours. A1C: No results found for: HGBA1C   Assessment/Plan 1. Localized swelling of right  lower leg -will rule out DVT at this time.  - VAS [place] LOWER EXTREMITY VENOUS (DVT); Future - CBC with Differential/Platelet  - BASIC METABOLIC PANEL WITH GFR  Jessica K. [place], [facility] [phone]

## 2018-12-25 ENCOUNTER — Telehealth: Payer: Self-pay

## 2018-12-25 ENCOUNTER — Ambulatory Visit (INDEPENDENT_AMBULATORY_CARE_PROVIDER_SITE_OTHER): Payer: Medicare Other | Admitting: Nurse Practitioner

## 2018-12-25 ENCOUNTER — Other Ambulatory Visit: Payer: Self-pay

## 2018-12-25 ENCOUNTER — Encounter: Payer: Self-pay | Admitting: Nurse Practitioner

## 2018-12-25 DIAGNOSIS — I824Y1 Acute embolism and thrombosis of unspecified deep veins of right proximal lower extremity: Secondary | ICD-10-CM

## 2018-12-25 DIAGNOSIS — I82431 Acute embolism and thrombosis of right popliteal vein: Secondary | ICD-10-CM | POA: Diagnosis not present

## 2018-12-25 DIAGNOSIS — I82451 Acute embolism and thrombosis of right peroneal vein: Secondary | ICD-10-CM | POA: Diagnosis not present

## 2018-12-25 DIAGNOSIS — R2241 Localized swelling, mass and lump, right lower limb: Secondary | ICD-10-CM | POA: Diagnosis not present

## 2018-12-25 DIAGNOSIS — I82441 Acute embolism and thrombosis of right tibial vein: Secondary | ICD-10-CM | POA: Diagnosis not present

## 2018-12-25 DIAGNOSIS — M79604 Pain in right leg: Secondary | ICD-10-CM | POA: Diagnosis not present

## 2018-12-25 LAB — BASIC METABOLIC PANEL WITH GFR
BUN/Creatinine Ratio: 19 (calc) (ref 6–22)
BUN: 23 mg/dL (ref 7–25)
CO2: 31 mmol/L (ref 20–32)
Calcium: 9.4 mg/dL (ref 8.6–10.3)
Chloride: 103 mmol/L (ref 98–110)
Creat: 1.19 mg/dL — ABNORMAL HIGH (ref 0.70–1.11)
GFR, Est African American: 60 mL/min/{1.73_m2} (ref >=60)
GFR, Est Non African American: 52 mL/min/{1.73_m2} — ABNORMAL LOW (ref >=60)
Glucose, Bld: 81 mg/dL (ref 65–99)
Potassium: 4.8 mmol/L (ref 3.5–5.3)
Sodium: 139 mmol/L (ref 135–146)

## 2018-12-25 LAB — CBC WITH DIFFERENTIAL/PLATELET
Absolute Monocytes: 736 cells/uL (ref 200–950)
Basophils Absolute: 74 cells/uL (ref 0–200)
Basophils Relative: 0.8 %
Eosinophils Absolute: 350 cells/uL (ref 15–500)
Eosinophils Relative: 3.8 %
HCT: 40 % (ref 38.5–50.0)
Hemoglobin: 12.7 g/dL — ABNORMAL LOW (ref 13.2–17.1)
Lymphs Abs: 2944 cells/uL (ref 850–3900)
MCH: 27.9 pg (ref 27.0–33.0)
MCHC: 31.8 g/dL — ABNORMAL LOW (ref 32.0–36.0)
MCV: 87.7 fL (ref 80.0–100.0)
MPV: 10 fL (ref 7.5–12.5)
Monocytes Relative: 8 %
Neutro Abs: 5097 cells/uL (ref 1500–7800)
Neutrophils Relative %: 55.4 %
Platelets: 255 10*3/uL (ref 140–400)
RBC: 4.56 10*6/uL (ref 4.20–5.80)
RDW: 12.9 % (ref 11.0–15.0)
Total Lymphocyte: 32 %
WBC: 9.2 10*3/uL (ref 3.8–10.8)

## 2018-12-25 MED ORDER — APIXABAN 5 MG PO TABS: ORAL_TABLET | ORAL | 3 refills | Status: DC

## 2018-12-25 NOTE — Patient Instructions (Signed)
To start Eliquis Apixaban 10 mg  by mouth twice daily for 7 days then decrease to 5 mg- which will be 1 tablet twice daily   Apixaban oral tablets What is this medicine? APIXABAN (a PIX a ban) is an anticoagulant (blood thinner). It is used to lower the chance of stroke in people with a medical condition called atrial fibrillation. It is also used to treat or prevent blood clots in the lungs or in the veins. This medicine may be used for other purposes; ask your health care provider or pharmacist if you have questions. COMMON BRAND NAME(S): Eliquis What should I tell my health care provider before I take this medicine? They need to know if you have any of these conditions: -bleeding disorders -bleeding in the brain -blood in your stools (black or tarry stools) or if you have blood in your vomit -history of stomach bleeding -kidney disease -liver disease -mechanical heart valve -an unusual or allergic reaction to apixaban, other medicines, foods, dyes, or preservatives -pregnant or trying to get pregnant -breast-feeding How should I use this medicine? Take this medicine by mouth with a glass of water. Follow the directions on the prescription label. You can take it with or without food. If it upsets your stomach, take it with food. Take your medicine at regular intervals. Do not take it more often than directed. Do not stop taking except on your doctor's advice. Stopping this medicine may increase your risk of a blood clot. Be sure to refill your prescription before you run out of medicine. Talk to your pediatrician regarding the use of this medicine in children. Special care may be needed. Overdosage: If you think you have taken too much of this medicine contact a poison control center or emergency room at once. NOTE: This medicine is only for you. Do not share this medicine with others. What if I miss a dose? If you miss a dose, take it as soon as you can. If it is almost time for your next  dose, take only that dose. Do not take double or extra doses. What may interact with this medicine? This medicine may interact with the following: -aspirin and aspirin-like medicines -certain medicines for fungal infections like ketoconazole and itraconazole -certain medicines for seizures like carbamazepine and phenytoin -certain medicines that treat or prevent blood clots like warfarin, enoxaparin, and dalteparin -clarithromycin -NSAIDs, medicines for pain and inflammation, like ibuprofen or naproxen -rifampin -ritonavir -St. John's wort This list may not describe all possible interactions. Give your health care provider a list of all the medicines, herbs, non-prescription drugs, or dietary supplements you use. Also tell them if you smoke, drink alcohol, or use illegal drugs. Some items may interact with your medicine. What should I watch for while using this medicine? Visit your healthcare professional for regular checks on your progress. You may need blood work done while you are taking this medicine. Your condition will be monitored carefully while you are receiving this medicine. It is important not to miss any appointments. Avoid sports and activities that might cause injury while you are using this medicine. Severe falls or injuries can cause unseen bleeding. Be careful when using sharp tools or knives. Consider using an Copy. Take special care brushing or flossing your teeth. Report any injuries, bruising, or red spots on the skin to your healthcare professional. If you are going to need surgery or other procedure, tell your healthcare professional that you are taking this medicine. Wear a medical ID bracelet  or chain. Carry a card that describes your disease and details of your medicine and dosage times. What side effects may I notice from receiving this medicine? Side effects that you should report to your doctor or health care professional as soon as possible: -allergic  reactions like skin rash, itching or hives, swelling of the face, lips, or tongue -signs and symptoms of bleeding such as bloody or black, tarry stools; red or dark-brown urine; spitting up blood or brown material that looks like coffee grounds; red spots on the skin; unusual bruising or bleeding from the eye, gums, or nose -signs and symptoms of a blood clot such as chest pain; shortness of breath; pain, swelling, or warmth in the leg -signs and symptoms of a stroke such as changes in vision; confusion; trouble speaking or understanding; severe headaches; sudden numbness or weakness of the face, arm or leg; trouble walking; dizziness; loss of coordination This list may not describe all possible side effects. Call your doctor for medical advice about side effects. You may report side effects to FDA at 1-800-FDA-1088. Where should I keep my medicine? Keep out of the reach of children. Store at room temperature between 20 and 25 degrees C (68 and 77 degrees F). Throw away any unused medicine after the expiration date. NOTE: This sheet is a summary. It may not cover all possible information. If you have questions about this medicine, talk to your doctor, pharmacist, or health care provider.  2019 Elsevier/Gold Standard (2017-07-05 11:20:07)

## 2018-12-25 NOTE — Progress Notes (Signed)
This service is provided via telemedicine  No vital signs collected/recorded due to the encounter was a telemedicine visit.   Location of patient (ex: home, work):  Home  Patient consents to a telephone visit:  Yes  Location of the provider (ex: office, home): [facility] Office   Name of any referring provider:  N/A  Names of all persons participating in the telemedicine service and their role in the encounter:  S.Chrae B/CMA, Sherrie Mustache, NP, daughter [name]), Mrs.Kellie Simmering and Patient   Time spent on call: 5 min with medical assistant      Careteam: Patient Care Team: [name], NP as PCP - General (Geriatric Medicine)  Advanced Directive information    Allergies  Allergen Reactions  . Codeine Other (See Comments)    Altered mental status  . Penicillins     Did it involve swelling of the face/tongue/throat, SOB, or low BP? Unknown Did it involve sudden or severe rash/hives, skin peeling, or any reaction on the inside of your mouth or nose? Unknown Did you need to seek medical attention at a hospital or doctor's office? Unknown When did it last happen? 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     Chief Complaint  Patient presents with  . Follow-up    Follow-up on venous u/s     HPI: Patient is a 85 y.o. male to discuss [facility] in [facility] and he was scheduled a tele-visit. He is not on any prescribed medication.  Pt recently with hospitalization due to urinary retention and now with chronic foley catheter, draining clear yellow urine. Continues with ongoing follow up with urology. He presented to office yesterday with ongoing worsening swelling to right lower leg.  Send for doppler. Doppler noted deep venous thrombosis noted in the right  popliteal, posterior tibial and peroneal veins.   Review of Systems:  Review of Systems  Constitutional: Negative for chills and fever.  Respiratory: Negative for cough, hemoptysis and shortness of breath.   Cardiovascular: Positive for leg swelling. Negative for chest pain, palpitations and claudication.  Genitourinary:       Foley catheter   Musculoskeletal: Negative for myalgias.    Past Medical History:  Diagnosis Date  . 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 Flu 1999   Hospitalized by Dr. Lenord Fellers [facility]   Past Surgical History:  Procedure Laterality Date  . HERNIA REPAIR    . KIDNEY STONE SURGERY     Basket extraction by Dr. Michela Pitcher    Social History:   reports that he quit smoking about 45 years ago. His smoking use included cigarettes. He has a 25.00 pack-year smoking history. He has never used smokeless tobacco. He reports previous alcohol use. He reports that he does not use drugs.  Family History  Problem Relation Age of Onset  . Pneumonia Mother   . Cancer Brother   . Brain cancer Brother   . Alzheimer's disease Sister   . Breast cancer Sister     Medications: Patient's Medications  New Prescriptions   No medications on file  Previous Medications   DIPHENHYDRAMINE (BENADRYL) 50 MG TABLET    Take 50 mg by mouth as needed for allergies.   IBUPROFEN (ADVIL) 200 MG TABLET    Take 200 mg by mouth as needed.  LORATADINE (CLARITIN) 10 MG TABLET    Take 10 mg by mouth as needed for allergies.  Modified Medications   No medications on file  Discontinued Medications   No medications on file    Physical Exam:  There were no vitals filed for this visit. There is no height or weight on file to calculate BMI. Wt Readings from Last 3 Encounters:  12/24/18 155 lb 12.8 oz (70.7 kg)  11/11/18 140 lb (63.5 kg)  09/27/18 154 lb (69.9 kg)      Labs reviewed: Basic Metabolic Panel: Recent Labs    09/27/18 1447 11/08/18 11/11/18 1714 12/24/18 1053  NA 139 140  143 139  K 4.7 6.1* 4.2 4.8  CL 101  --  109 103  CO2 29  --  27 31  GLUCOSE 83  --  122* 81  BUN 22 85* 30* 23  CREATININE 1.21* 7.3* 1.29* 1.19*  CALCIUM 9.4  --  8.6* 9.4   Liver Function Tests: Recent Labs    09/27/18 1447 11/11/18 1714  AST 19 22  ALT 12 23  ALKPHOS  --  64  BILITOT 1.1 0.5  PROT 7.2 6.9  ALBUMIN  --  3.1*   No results for input(s): LIPASE, AMYLASE in the last 8760 hours. No results for input(s): AMMONIA in the last 8760 hours. CBC: Recent Labs    09/27/18 1447 11/08/18 11/11/18 1714 12/24/18 1053  WBC 10.6 14.4 16.4* 9.2  NEUTROABS 6,837  --  11.7* 5,097  HGB 14.3 12.8* 11.9* 12.7*  HCT 43.8 41 39.6 40.0  MCV 87.4  --  93.6 87.7  PLT 263 174 260 255   Lipid Panel: No results for input(s): CHOL, HDL, LDLCALC, TRIG, CHOLHDL, LDLDIRECT in the last 8760 hours. TSH: No results for input(s): TSH in the last 8760 hours. A1C: No results found for: HGBA1C   Assessment/Plan 1. Acute deep vein thrombosis (DVT) of proximal vein of right lower extremity (HCC) Agreeable to treatment with Eliquis for DVT. Discussed risk vs benefit.  He has had no recent falls and uses walker. Will start eliquis - apixaban (ELIQUIS) 5 MG TABS tablet; 10 mg by mouth twice daily for 7 days then to start 5 mg by mouth twice daily for DVT  Dispense: 74 tablet; Refill: 3 Information regarding eliquis will be mailed to pt. To consult our office of pharmacy with any additional questions. To go to ED immediately for any signs of bleeding.  -CBC and BMP to follow up in 1 month  Next appt: 03/03/2019 Janett Billow K. Kemesha Mosey, [facility] [phone]    Virtual Visit via Telephone Note  I connected with pt, daughter and wife on 12/25/18 at  3:15 PM EDT by telephone and verified that I am speaking with the correct person using two identifiers.  Location: Patient: home Provider: office   I discussed the limitations, risks, security and privacy  concerns of performing an evaluation and management service by telephone and the availability of in person appointments. I also discussed with the patient that there may be a patient responsible charge related to this service. The patient expressed understanding and agreed to proceed.   I discussed the assessment and treatment plan with the patient. The patient was provided an opportunity to ask questions and all were answered. The patient agreed with the plan and demonstrated an understanding of the instructions.   The patient was advised to call back or seek an in-person evaluation if the symptoms  worsen or if the condition fails to improve as anticipated.  I provided 15 minutes of non-face-to-face time during this encounter.  [name]. Harle Battiest Avs printed and mailed

## 2018-12-25 NOTE — Telephone Encounter (Signed)
Diane from [facility] called with results from Roger Gardner's U/S and she would been sending a fax over with results from ultrasound.  Diane stated results were positive for DVT on right popliteal, posterior tibial and peroneal veins.

## 2018-12-26 DIAGNOSIS — J302 Other seasonal allergic rhinitis: Secondary | ICD-10-CM | POA: Diagnosis not present

## 2018-12-26 DIAGNOSIS — R339 Retention of urine, unspecified: Secondary | ICD-10-CM | POA: Diagnosis not present

## 2018-12-26 DIAGNOSIS — R296 Repeated falls: Secondary | ICD-10-CM | POA: Diagnosis not present

## 2018-12-26 DIAGNOSIS — Z87891 Personal history of nicotine dependence: Secondary | ICD-10-CM | POA: Diagnosis not present

## 2018-12-26 DIAGNOSIS — F0391 Unspecified dementia with behavioral disturbance: Secondary | ICD-10-CM | POA: Diagnosis not present

## 2018-12-26 DIAGNOSIS — M6281 Muscle weakness (generalized): Secondary | ICD-10-CM | POA: Diagnosis not present

## 2018-12-27 ENCOUNTER — Ambulatory Visit: Payer: Self-pay | Admitting: Nurse Practitioner

## 2018-12-27 ENCOUNTER — Ambulatory Visit (INDEPENDENT_AMBULATORY_CARE_PROVIDER_SITE_OTHER): Payer: Medicare Other | Admitting: Nurse Practitioner

## 2018-12-27 ENCOUNTER — Other Ambulatory Visit: Payer: Self-pay

## 2018-12-27 ENCOUNTER — Encounter: Payer: Self-pay | Admitting: Nurse Practitioner

## 2018-12-27 VITALS — BP 140/68 | HR 79 | Temp 98.1°F | Ht 66.0 in | Wt 154.0 lb

## 2018-12-27 DIAGNOSIS — I824Y1 Acute embolism and thrombosis of unspecified deep veins of right proximal lower extremity: Secondary | ICD-10-CM

## 2018-12-27 DIAGNOSIS — R339 Retention of urine, unspecified: Secondary | ICD-10-CM

## 2018-12-27 DIAGNOSIS — R82998 Other abnormal findings in urine: Secondary | ICD-10-CM | POA: Diagnosis not present

## 2018-12-27 LAB — POCT URINALYSIS DIPSTICK
Bilirubin, UA: NEGATIVE
Blood, UA: NEGATIVE
Glucose, UA: NEGATIVE
Ketones, UA: NEGATIVE
Nitrite, UA: POSITIVE
Protein, UA: POSITIVE — AB
Spec Grav, UA: <=1.005 — AB (ref 1.010–1.025)
Urobilinogen, UA: 0.2 E.U./dL
pH, UA: 8.5 — AB (ref 5.0–8.0)

## 2018-12-27 LAB — CBC WITH DIFFERENTIAL/PLATELET
Absolute Monocytes: 778 cells/uL (ref 200–950)
Basophils Absolute: 65 cells/uL (ref 0–200)
Basophils Relative: 0.6 %
Eosinophils Absolute: 378 cells/uL (ref 15–500)
Eosinophils Relative: 3.5 %
HCT: 39.7 % (ref 38.5–50.0)
Hemoglobin: 12.8 g/dL — ABNORMAL LOW (ref 13.2–17.1)
Lymphs Abs: 3305 cells/uL (ref 850–3900)
MCH: 27.9 pg (ref 27.0–33.0)
MCHC: 32.2 g/dL (ref 32.0–36.0)
MCV: 86.7 fL (ref 80.0–100.0)
MPV: 10 fL (ref 7.5–12.5)
Monocytes Relative: 7.2 %
Neutro Abs: 6275 cells/uL (ref 1500–7800)
Neutrophils Relative %: 58.1 %
Platelets: 267 10*3/uL (ref 140–400)
RBC: 4.58 10*6/uL (ref 4.20–5.80)
RDW: 12.9 % (ref 11.0–15.0)
Total Lymphocyte: 30.6 %
WBC: 10.8 10*3/uL (ref 3.8–10.8)

## 2018-12-27 NOTE — Patient Instructions (Signed)
Increase water intake  To continue to monitor for blood in urine, it appears he just needs to drink more fluid at this time  When urology changes catheter they may opt to send for culture. To monitor for fever, abdominal pain, increase confusion or weakness

## 2018-12-27 NOTE — Progress Notes (Signed)
Careteam: Patient Care Team: [name], NP as PCP - General (Geriatric Medicine)  Advanced Directive information    Allergies  Allergen Reactions  . Codeine Other (See Comments)    Altered mental status  . Penicillins     Did it involve swelling of the face/tongue/throat, SOB, or low BP? Unknown Did it involve sudden or severe rash/hives, skin peeling, or any reaction on the inside of your mouth or nose? Unknown Did you need to seek medical attention at a hospital or doctor's office? Unknown When did it last happen? 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     Chief Complaint  Patient presents with  . Acute Visit    Patient with blood in cath. Patient was recently started on a a blood thinner. Here with daughter     HPI: Patient is a 85 y.o. male seen in the office today due to darker urine that the family noticed this morning. Pt with hx of dementia, urinary retention with chronic foley and DVT Family reports urine was clear yellow yesterday but now more of a darkest tint and concerned of blood due to recently started on eliquis for DVT Daughter reports he does not drink much water. Prefers milk and coffee.  No fever, abdominal pain. Overall at baseline otherwise.   swelling and redness in right lower leg has already improved per daughter. Apparently got worse after OV but has now improved.  Wearing leg bag on left leg now.  Review of Systems:  Review of Systems  Constitutional: Negative for chills, fever and malaise/fatigue.  Respiratory: Negative for cough and hemoptysis.   Cardiovascular: Positive for leg swelling. Negative for chest pain and palpitations.  Genitourinary:       Chronic foley with darker urine  Neurological: Negative for weakness.    Past Medical History:  Diagnosis Date  .  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 Flu 1999   Hospitalized by Dr. Lenord Fellers [facility]   Past Surgical History:  Procedure Laterality Date  . HERNIA REPAIR    . KIDNEY STONE SURGERY     Basket extraction by Dr. Michela Pitcher    Social History:   reports that he quit smoking about 45 years ago. His smoking use included cigarettes. He has a 25.00 pack-year smoking history. He has never used smokeless tobacco. He reports previous alcohol use. He reports that he does not use drugs.  Family History  Problem Relation Age of Onset  . Pneumonia Mother   . Cancer Brother   . Brain cancer Brother   . Alzheimer's disease Sister   . Breast cancer Sister     Medications: Patient's Medications  New Prescriptions   No medications on file  Previous Medications   APIXABAN (ELIQUIS) 5 MG TABS TABLET    10 mg by mouth twice daily for 7 days then to start 5 mg by mouth twice daily for DVT   DIPHENHYDRAMINE (BENADRYL) 50 MG TABLET    Take 50 mg by mouth as needed for allergies.   LORATADINE (CLARITIN) 10 MG TABLET    Take 10 mg by mouth as needed for allergies.  Modified Medications   No medications on file  Discontinued Medications   No medications on file    Physical Exam:  Vitals:   12/27/18 1128  BP: 140/68  Pulse: 79  Temp: 98.1 F (36.7 C)  TempSrc: Oral  SpO2: 96%  Weight: 154 lb (69.9 kg)  Height: 5\' 6"  (1.676 m)   Body mass index is 24.86 kg/m. Wt Readings from Last 3 Encounters:  12/27/18 154 lb (69.9 kg)  12/24/18 155 lb 12.8 oz (70.7 kg)  11/11/18 140 lb (63.5 kg)    Physical Exam Constitutional:      General: He is not in acute distress.    Appearance: Normal appearance. He is well-developed. He is not diaphoretic.     Comments: Frail thin built elderly man  HENT:     Head: Normocephalic and atraumatic.     Nose: Nose normal.     Mouth/Throat:     Mouth: Mucous membranes are moist.     Pharynx: No oropharyngeal exudate.  Eyes:     Conjunctiva/sclera: Conjunctivae  normal.     Pupils: Pupils are equal, round, and reactive to light.  Neck:     Musculoskeletal: Normal range of motion and neck supple.  Cardiovascular:     Rate and Rhythm: Normal rate and regular rhythm.     Heart sounds: Normal heart sounds.  Pulmonary:     Effort: Pulmonary effort is normal.     Breath sounds: Normal breath sounds.  Abdominal:     General: Abdomen is flat. Bowel sounds are normal. There is no distension.     Palpations: Abdomen is soft.     Tenderness: There is no abdominal tenderness.  Musculoskeletal:     Right lower leg: He exhibits tenderness and swelling. Edema present.     Comments: Right lower leg with 1+ edema, tender to calf on exam. Slight redness and warmth noted compared to left.   Skin:    General: Skin is warm and dry.     Findings: No rash.  Neurological:     Mental Status: He is alert and oriented to person, place, and time.     Labs reviewed: Basic Metabolic Panel: Recent Labs    09/27/18 1447 11/08/18 11/11/18 1714 12/24/18 1053  NA 139 140 143 139  K 4.7 6.1* 4.2 4.8  CL 101  --  109 103  CO2 29  --  27 31  GLUCOSE 83  --  122* 81  BUN 22 85* 30* 23  CREATININE 1.21* 7.3* 1.29* 1.19*  CALCIUM 9.4  --  8.6* 9.4   Liver Function Tests: Recent Labs    09/27/18 1447 11/11/18 1714  AST 19 22  ALT 12 23  ALKPHOS  --  64  BILITOT 1.1 0.5  PROT 7.2 6.9  ALBUMIN  --  3.1*   No results for input(s): LIPASE, AMYLASE in the last 8760 hours. No results for input(s): AMMONIA in the last 8760 hours. CBC: Recent Labs    09/27/18 1447 11/08/18 11/11/18 1714 12/24/18 1053  WBC 10.6 14.4 16.4* 9.2  NEUTROABS 6,837  --  11.7* 5,097  HGB 14.3 12.8* 11.9* 12.7*  HCT 43.8 41 39.6 40.0  MCV 87.4  --  93.6 87.7  PLT 263 174 260 255   Lipid Panel: No results for input(s): CHOL, HDL, LDLCALC, TRIG, CHOLHDL, LDLDIRECT in the last 8760 hours. TSH: No results for input(s): TSH in the last 8760 hours. A1C: No results found for: HGBA1C    Assessment/Plan 1. Acute deep vein thrombosis (DVT) of proximal vein of right lower extremity ([place]) Has started on eliquis and tolerating well. No signs of bruising or bleeding. catheter with dark urine, UA negative for blood. -  swelling, pain and redness has improved. - CBC with Differential/Platelet  2. Dark urine - POC Urinalysis Dipstick negative for blood, pt is due for catheter change per urologist, daughter plans to call about getting a sooner appt to see about possibly getting a culture done. Leukocytes noted however urine was sitting in leg bag and it was not a clean sample. He is currently asymptomatic for any infection at this time. Encouraged to increase hydration.  -will follow up CBC  3. Urinary retention Ongoing catheter, plans to follow up with urologist for catheter change.   next appt: 01/28/2019 [name]. [place] [phone]

## 2018-12-28 DIAGNOSIS — J302 Other seasonal allergic rhinitis: Secondary | ICD-10-CM | POA: Diagnosis not present

## 2018-12-28 DIAGNOSIS — R296 Repeated falls: Secondary | ICD-10-CM | POA: Diagnosis not present

## 2018-12-28 DIAGNOSIS — Z87891 Personal history of nicotine dependence: Secondary | ICD-10-CM | POA: Diagnosis not present

## 2018-12-28 DIAGNOSIS — R339 Retention of urine, unspecified: Secondary | ICD-10-CM | POA: Diagnosis not present

## 2018-12-28 DIAGNOSIS — M6281 Muscle weakness (generalized): Secondary | ICD-10-CM | POA: Diagnosis not present

## 2018-12-28 DIAGNOSIS — F0391 Unspecified dementia with behavioral disturbance: Secondary | ICD-10-CM | POA: Diagnosis not present

## 2018-12-30 ENCOUNTER — Encounter: Payer: Self-pay | Admitting: Nurse Practitioner

## 2018-12-30 ENCOUNTER — Ambulatory Visit (INDEPENDENT_AMBULATORY_CARE_PROVIDER_SITE_OTHER): Payer: Medicare Other | Admitting: Nurse Practitioner

## 2018-12-30 ENCOUNTER — Other Ambulatory Visit: Payer: Self-pay

## 2018-12-30 DIAGNOSIS — J302 Other seasonal allergic rhinitis: Secondary | ICD-10-CM | POA: Diagnosis not present

## 2018-12-30 DIAGNOSIS — M6281 Muscle weakness (generalized): Secondary | ICD-10-CM | POA: Diagnosis not present

## 2018-12-30 DIAGNOSIS — R82998 Other abnormal findings in urine: Secondary | ICD-10-CM | POA: Diagnosis not present

## 2018-12-30 DIAGNOSIS — Z87891 Personal history of nicotine dependence: Secondary | ICD-10-CM | POA: Diagnosis not present

## 2018-12-30 DIAGNOSIS — Z978 Presence of other specified devices: Secondary | ICD-10-CM

## 2018-12-30 DIAGNOSIS — F0391 Unspecified dementia with behavioral disturbance: Secondary | ICD-10-CM | POA: Diagnosis not present

## 2018-12-30 DIAGNOSIS — R451 Restlessness and agitation: Secondary | ICD-10-CM | POA: Diagnosis not present

## 2018-12-30 DIAGNOSIS — R296 Repeated falls: Secondary | ICD-10-CM | POA: Diagnosis not present

## 2018-12-30 DIAGNOSIS — Z960 Presence of urogenital implants: Secondary | ICD-10-CM

## 2018-12-30 DIAGNOSIS — R339 Retention of urine, unspecified: Secondary | ICD-10-CM | POA: Diagnosis not present

## 2018-12-30 MED ORDER — UNABLE TO FIND: 1 refills | Status: DC

## 2018-12-30 NOTE — Progress Notes (Signed)
This service is provided via telemedicine  No vital signs collected/recorded due to the encounter was a telemedicine visit.   Location of patient (ex: home, work):  Home   Patient consents to a telephone visit:  Yes  Location of the provider (ex: office, home):  [facility], Office   Name of any referring provider:  N/A  Names of all persons participating in the telemedicine service and their role in the encounter:  S.Chrae B/CMA, Sherrie Mustache, NP, daughter(Brenda) and Patient   Time spent on call:  5 min with medical assistant      Careteam: Patient Care Team: [name], NP as PCP - General (Geriatric Medicine)  Advanced Directive information    Allergies  Allergen Reactions  . Codeine Other (See Comments)    Altered mental status  . Penicillins     Did it involve swelling of the face/tongue/throat, SOB, or low BP? Unknown Did it involve sudden or severe rash/hives, skin peeling, or any reaction on the inside of your mouth or nose? Unknown Did you need to seek medical attention at a hospital or doctor's office? Unknown When did it last happen? 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     Chief Complaint  Patient presents with  . Acute Visit    Patient c/o sleep issues. Patient with trouble staying and falling asleep. Patient pulled cather out this am. Patient appears to be irritable and fridgty. Telephone visit.      HPI: Patient is a 85 y.o. male due to increase agitation and restless.  Daughter reports he has been really irritable. Could not keep his hands off his catheter. Pulled it out over the weekend, home health had to come back in and put the catheter back him.  Reports he had been fine until they put a new catheter in which was bigger than his previous one. Plans to get a new  leg back for him vs having the full catheter. Plans to get him a "busy blanket" has been giving him activities to keep him busy. He has been really restless over the weekend.  Using [facility].  Would like something to help him rest at night.   Urine continues to be dark/brown. Can not get him to drink much more water.  No fever or chills. Took a nap today because he was up a lot last night.   Review of Systems:  Review of Systems  Unable to perform ROS: Dementia    Past Medical History:  Diagnosis Date  . 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 Flu 1999   Hospitalized by Dr. Lenord Fellers IM   Past Surgical History:  Procedure Laterality Date  . HERNIA REPAIR    . KIDNEY STONE SURGERY     Basket extraction by Dr. Michela Pitcher    Social History:   reports that he quit smoking about 45 years ago. His smoking use included cigarettes. He has a 25.00 pack-year smoking history. He has never used smokeless tobacco. He reports previous alcohol use. He reports that he does not use drugs.  Family History  Problem Relation Age of Onset  . Pneumonia Mother   . Cancer Brother   . Brain cancer Brother   . Alzheimer's disease Sister   . Breast cancer Sister     Medications: Patient's Medications  New Prescriptions   No medications on file  Previous Medications   APIXABAN (ELIQUIS) 5 MG TABS TABLET    10 mg by mouth twice daily for 7 days then to start 5 mg by mouth twice daily for DVT   DIPHENHYDRAMINE (BENADRYL) 50 MG TABLET    Take 50 mg by mouth as needed for allergies.   LORATADINE (CLARITIN) 10 MG TABLET    Take 10 mg by mouth as needed for allergies.  Modified Medications   No medications on file  Discontinued Medications   No medications on file    Physical Exam:  There were no vitals filed for this visit. There is no height or weight on file to calculate BMI. Wt Readings from Last 3 Encounters:  12/27/18 154 lb (69.9 kg)  12/24/18 155 lb 12.8 oz (70.7 kg)   11/11/18 140 lb (63.5 kg)      Labs reviewed: Basic Metabolic Panel: Recent Labs    09/27/18 1447 11/08/18 11/11/18 1714 12/24/18 1053  NA 139 140 143 139  K 4.7 6.1* 4.2 4.8  CL 101  --  109 103  CO2 29  --  27 31  GLUCOSE 83  --  122* 81  BUN 22 85* 30* 23  CREATININE 1.21* 7.3* 1.29* 1.19*  CALCIUM 9.4  --  8.6* 9.4   Liver Function Tests: Recent Labs    09/27/18 1447 11/11/18 1714  AST 19 22  ALT 12 23  ALKPHOS  --  64  BILITOT 1.1 0.5  PROT 7.2 6.9  ALBUMIN  --  3.1*   No results for input(s): LIPASE, AMYLASE in the last 8760 hours. No results for input(s): AMMONIA in the last 8760 hours. CBC: Recent Labs    11/11/18 1714 12/24/18 1053 12/27/18 1159  WBC 16.4* 9.2 10.8  NEUTROABS 11.7* 5,097 6,275  HGB 11.9* 12.7* 12.8*  HCT 39.6 40.0 39.7  MCV 93.6 87.7 86.7  PLT 260 255 267   Lipid Panel: No results for input(s): CHOL, HDL, LDLCALC, TRIG, CHOLHDL, LDLDIRECT in the last 8760 hours. TSH: No results for input(s): TSH in the last 8760 hours. A1C: No results found for: HGBA1C   Assessment/Plan 1. Agitation -question if he is not more restless at night due to UTI.  -can use melatonin 3 mg by mouth at bedtime to help with sleep.  Encouraged bedtime routine. - UNABLE TO FIND; Order: Urinalysis, culture and sensitivity. Fax results to [phone] Attention Janett Billow  Dispense: 1 each; Refill: 1  2. Chronic indwelling Foley catheter -ongoing issues, question UTI. Increase hydration. Plan is to attach leg bag so will be tolerable to pt - UNABLE TO FIND; Order: Urinalysis, culture and sensitivity. Fax results to [phone] Attention Janett Billow  Dispense: 1 each; Refill: 1  3. Dark urine - home health to collect urine sample UA C&S -increase fluid.   Next appt: 01/28/2019, sooner if needed  [name]. Harle Battiest  [facility] [phone]    Virtual Visit via Telephone Note  I connected with@ on 12/31/18 at  1:00 PM  EDT by telephone and verified that I am speaking with the correct person using two identifiers.  Location: Patient: home Provider: office    I discussed the limitations, risks, security and privacy concerns of performing an evaluation and management service by telephone and the availability of in person appointments. I also discussed with the patient that there may be a patient responsible charge related to this service. The patient expressed understanding and  agreed to proceed.   I discussed the assessment and treatment plan with the patient. The patient was provided an opportunity to ask questions and all were answered. The patient agreed with the plan and demonstrated an understanding of the instructions.   The patient was advised to call back or seek an in-person evaluation if the symptoms worsen or if the condition fails to improve as anticipated.  I provided 18 minutes of non-face-to-face time during this encounter.  [name]. Harle Battiest Avs printed and mailed

## 2018-12-30 NOTE — Patient Instructions (Addendum)
To use melatonin 3 mg by mouth at bedtime for sleep.   Will send order over to home health to have them check his urine  Insomnia Insomnia is a sleep disorder that makes it difficult to fall asleep or stay asleep. Insomnia can cause fatigue, low energy, difficulty concentrating, mood swings, and poor performance at work or school. There are three different ways to classify insomnia:  Difficulty falling asleep.  Difficulty staying asleep.  Waking up too early in the morning. Any type of insomnia can be long-term (chronic) or short-term (acute). Both are common. Short-term insomnia usually lasts for three months or less. Chronic insomnia occurs at least three times a week for longer than three months. What are the causes? Insomnia may be caused by another condition, situation, or substance, such as:  Anxiety.  Certain medicines.  Gastroesophageal reflux disease (GERD) or other gastrointestinal conditions.  Asthma or other breathing conditions.  Restless legs syndrome, sleep apnea, or other sleep disorders.  Chronic pain.  Menopause.  Stroke.  Abuse of alcohol, tobacco, or illegal drugs.  Mental health conditions, such as depression.  Caffeine.  Neurological disorders, such as Alzheimer's disease.  An overactive thyroid (hyperthyroidism). Sometimes, the cause of insomnia may not be known. What increases the risk? Risk factors for insomnia include:  Gender. Women are affected more often than men.  Age. Insomnia is more common as you get older.  Stress.  Lack of exercise.  Irregular work schedule or working night shifts.  Traveling between different time zones.  Certain medical and mental health conditions. What are the signs or symptoms? If you have insomnia, the main symptom is having trouble falling asleep or having trouble staying asleep. This may lead to other symptoms, such as:  Feeling fatigued or having low energy.  Feeling nervous about going to  sleep.  Not feeling rested in the morning.  Having trouble concentrating.  Feeling irritable, anxious, or depressed. How is this diagnosed? This condition may be diagnosed based on:  Your symptoms and medical history. Your health care provider may ask about: ? Your sleep habits. ? Any medical conditions you have. ? Your mental health.  A physical exam. How is this treated? Treatment for insomnia depends on the cause. Treatment may focus on treating an underlying condition that is causing insomnia. Treatment may also include:  Medicines to help you sleep.  Counseling or therapy.  Lifestyle adjustments to help you sleep better. Follow these instructions at home: Eating and drinking   Limit or avoid alcohol, caffeinated beverages, and cigarettes, especially close to bedtime. These can disrupt your sleep.  Do not eat a large meal or eat spicy foods right before bedtime. This can lead to digestive discomfort that can make it hard for you to sleep. Sleep habits   Keep a sleep diary to help you and your health care provider figure out what could be causing your insomnia. Write down: ? When you sleep. ? When you wake up during the night. ? How well you sleep. ? How rested you feel the next day. ? Any side effects of medicines you are taking. ? What you eat and drink.  Make your bedroom a dark, comfortable place where it is easy to fall asleep. ? Put up shades or blackout curtains to block light from outside. ? Use a white noise machine to block noise. ? Keep the temperature cool.  Limit screen use before bedtime. This includes: ? Watching TV. ? Using your smartphone, tablet, or computer.  Stick  to a routine that includes going to bed and waking up at the same times every day and night. This can help you fall asleep faster. Consider making a quiet activity, such as reading, part of your nighttime routine.  Try to avoid taking naps during the day so that you sleep better at  night.  Get out of bed if you are still awake after 15 minutes of trying to sleep. Keep the lights down, but try reading or doing a quiet activity. When you feel sleepy, go back to bed. General instructions  Take over-the-counter and prescription medicines only as told by your health care provider.  Exercise regularly, as told by your health care provider. Avoid exercise starting several hours before bedtime.  Use relaxation techniques to manage stress. Ask your health care provider to suggest some techniques that may work well for you. These may include: ? Breathing exercises. ? Routines to release muscle tension. ? Visualizing peaceful scenes.  Make sure that you drive carefully. Avoid driving if you feel very sleepy.  Keep all follow-up visits as told by your health care provider. This is important. Contact a health care provider if:  You are tired throughout the day.  You have trouble in your daily routine due to sleepiness.  You continue to have sleep problems, or your sleep problems get worse. Get help right away if:  You have serious thoughts about hurting yourself or someone else. If you ever feel like you may hurt yourself or others, or have thoughts about taking your own life, get help right away. You can go to your nearest emergency department or call:  Your local emergency services (911 in the [place]  A suicide crisis helpline, such as the [place] at 828-070-0487. This is open 24 hours a day. Summary  Insomnia is a sleep disorder that makes it difficult to fall asleep or stay asleep.  Insomnia can be long-term (chronic) or short-term (acute).  Treatment for insomnia depends on the cause. Treatment may focus on treating an underlying condition that is causing insomnia.  Keep a sleep diary to help you and your health care provider figure out what could be causing your insomnia. This information is not intended to replace advice given  to you by your health care provider. Make sure you discuss any questions you have with your health care provider. Document Released: 07/07/2000 Document Revised: 04/19/2017 Document Reviewed: 04/19/2017 Elsevier Interactive Patient Education  2019 [employer].

## 2018-12-31 DIAGNOSIS — Z87891 Personal history of nicotine dependence: Secondary | ICD-10-CM | POA: Diagnosis not present

## 2018-12-31 DIAGNOSIS — R339 Retention of urine, unspecified: Secondary | ICD-10-CM | POA: Diagnosis not present

## 2018-12-31 DIAGNOSIS — F0391 Unspecified dementia with behavioral disturbance: Secondary | ICD-10-CM | POA: Diagnosis not present

## 2018-12-31 DIAGNOSIS — M6281 Muscle weakness (generalized): Secondary | ICD-10-CM | POA: Diagnosis not present

## 2018-12-31 DIAGNOSIS — R296 Repeated falls: Secondary | ICD-10-CM | POA: Diagnosis not present

## 2018-12-31 DIAGNOSIS — J302 Other seasonal allergic rhinitis: Secondary | ICD-10-CM | POA: Diagnosis not present

## 2019-01-02 ENCOUNTER — Encounter: Payer: Self-pay | Admitting: Internal Medicine

## 2019-01-02 DIAGNOSIS — J302 Other seasonal allergic rhinitis: Secondary | ICD-10-CM | POA: Diagnosis not present

## 2019-01-02 DIAGNOSIS — F0391 Unspecified dementia with behavioral disturbance: Secondary | ICD-10-CM | POA: Diagnosis not present

## 2019-01-02 DIAGNOSIS — N390 Urinary tract infection, site not specified: Secondary | ICD-10-CM | POA: Diagnosis not present

## 2019-01-02 DIAGNOSIS — R339 Retention of urine, unspecified: Secondary | ICD-10-CM | POA: Diagnosis not present

## 2019-01-02 DIAGNOSIS — Z87891 Personal history of nicotine dependence: Secondary | ICD-10-CM | POA: Diagnosis not present

## 2019-01-02 DIAGNOSIS — M6281 Muscle weakness (generalized): Secondary | ICD-10-CM | POA: Diagnosis not present

## 2019-01-02 DIAGNOSIS — R296 Repeated falls: Secondary | ICD-10-CM | POA: Diagnosis not present

## 2019-01-05 DIAGNOSIS — M6281 Muscle weakness (generalized): Secondary | ICD-10-CM | POA: Diagnosis not present

## 2019-01-05 DIAGNOSIS — Z87891 Personal history of nicotine dependence: Secondary | ICD-10-CM | POA: Diagnosis not present

## 2019-01-05 DIAGNOSIS — Z466 Encounter for fitting and adjustment of urinary device: Secondary | ICD-10-CM | POA: Diagnosis not present

## 2019-01-05 DIAGNOSIS — Z743 Need for continuous supervision: Secondary | ICD-10-CM

## 2019-01-05 DIAGNOSIS — R296 Repeated falls: Secondary | ICD-10-CM

## 2019-01-05 DIAGNOSIS — J302 Other seasonal allergic rhinitis: Secondary | ICD-10-CM | POA: Diagnosis not present

## 2019-01-05 DIAGNOSIS — R339 Retention of urine, unspecified: Secondary | ICD-10-CM | POA: Diagnosis not present

## 2019-01-05 DIAGNOSIS — F0391 Unspecified dementia with behavioral disturbance: Secondary | ICD-10-CM | POA: Diagnosis not present

## 2019-01-05 DIAGNOSIS — R41841 Cognitive communication deficit: Secondary | ICD-10-CM | POA: Diagnosis not present

## 2019-01-06 ENCOUNTER — Telehealth: Payer: Self-pay | Admitting: *Deleted

## 2019-01-06 MED ORDER — CIPROFLOXACIN HCL 500 MG PO TABS: 500.0000 mg | ORAL_TABLET | Freq: Two times a day (BID) | ORAL | 0 refills | Status: DC

## 2019-01-06 NOTE — Telephone Encounter (Signed)
Received Urine Culture Results from [facility]. Abnormal results Pseudomonas Aeruginosa.   Per Georgeann Oppenheim Ciprofloxacin 500mg  twice daily for 7 days and push fluids.   Rx sent to pharmacy and wife notified and agreed. Patient has an appointment with Urology on the 30th.

## 2019-01-07 DIAGNOSIS — J302 Other seasonal allergic rhinitis: Secondary | ICD-10-CM | POA: Diagnosis not present

## 2019-01-07 DIAGNOSIS — M6281 Muscle weakness (generalized): Secondary | ICD-10-CM | POA: Diagnosis not present

## 2019-01-07 DIAGNOSIS — R339 Retention of urine, unspecified: Secondary | ICD-10-CM | POA: Diagnosis not present

## 2019-01-07 DIAGNOSIS — Z87891 Personal history of nicotine dependence: Secondary | ICD-10-CM | POA: Diagnosis not present

## 2019-01-07 DIAGNOSIS — F0391 Unspecified dementia with behavioral disturbance: Secondary | ICD-10-CM | POA: Diagnosis not present

## 2019-01-07 DIAGNOSIS — Z466 Encounter for fitting and adjustment of urinary device: Secondary | ICD-10-CM | POA: Diagnosis not present

## 2019-01-08 DIAGNOSIS — M6281 Muscle weakness (generalized): Secondary | ICD-10-CM | POA: Diagnosis not present

## 2019-01-08 DIAGNOSIS — J302 Other seasonal allergic rhinitis: Secondary | ICD-10-CM | POA: Diagnosis not present

## 2019-01-08 DIAGNOSIS — F0391 Unspecified dementia with behavioral disturbance: Secondary | ICD-10-CM | POA: Diagnosis not present

## 2019-01-08 DIAGNOSIS — R339 Retention of urine, unspecified: Secondary | ICD-10-CM | POA: Diagnosis not present

## 2019-01-08 DIAGNOSIS — Z466 Encounter for fitting and adjustment of urinary device: Secondary | ICD-10-CM | POA: Diagnosis not present

## 2019-01-08 DIAGNOSIS — Z87891 Personal history of nicotine dependence: Secondary | ICD-10-CM | POA: Diagnosis not present

## 2019-01-15 DIAGNOSIS — R339 Retention of urine, unspecified: Secondary | ICD-10-CM | POA: Diagnosis not present

## 2019-01-15 DIAGNOSIS — F0391 Unspecified dementia with behavioral disturbance: Secondary | ICD-10-CM | POA: Diagnosis not present

## 2019-01-15 DIAGNOSIS — Z466 Encounter for fitting and adjustment of urinary device: Secondary | ICD-10-CM | POA: Diagnosis not present

## 2019-01-15 DIAGNOSIS — M6281 Muscle weakness (generalized): Secondary | ICD-10-CM | POA: Diagnosis not present

## 2019-01-15 DIAGNOSIS — Z87891 Personal history of nicotine dependence: Secondary | ICD-10-CM | POA: Diagnosis not present

## 2019-01-15 DIAGNOSIS — J302 Other seasonal allergic rhinitis: Secondary | ICD-10-CM | POA: Diagnosis not present

## 2019-01-21 DIAGNOSIS — Z466 Encounter for fitting and adjustment of urinary device: Secondary | ICD-10-CM | POA: Diagnosis not present

## 2019-01-21 DIAGNOSIS — M6281 Muscle weakness (generalized): Secondary | ICD-10-CM | POA: Diagnosis not present

## 2019-01-21 DIAGNOSIS — Z87891 Personal history of nicotine dependence: Secondary | ICD-10-CM | POA: Diagnosis not present

## 2019-01-21 DIAGNOSIS — R339 Retention of urine, unspecified: Secondary | ICD-10-CM | POA: Diagnosis not present

## 2019-01-21 DIAGNOSIS — F0391 Unspecified dementia with behavioral disturbance: Secondary | ICD-10-CM | POA: Diagnosis not present

## 2019-01-21 DIAGNOSIS — J302 Other seasonal allergic rhinitis: Secondary | ICD-10-CM | POA: Diagnosis not present

## 2019-01-22 ENCOUNTER — Other Ambulatory Visit: Payer: Self-pay

## 2019-01-22 ENCOUNTER — Encounter: Payer: Self-pay | Admitting: Nurse Practitioner

## 2019-01-22 ENCOUNTER — Ambulatory Visit (INDEPENDENT_AMBULATORY_CARE_PROVIDER_SITE_OTHER): Payer: Medicare Other | Admitting: Nurse Practitioner

## 2019-01-22 DIAGNOSIS — Z978 Presence of other specified devices: Secondary | ICD-10-CM

## 2019-01-22 DIAGNOSIS — M6281 Muscle weakness (generalized): Secondary | ICD-10-CM | POA: Diagnosis not present

## 2019-01-22 DIAGNOSIS — F0390 Unspecified dementia without behavioral disturbance: Secondary | ICD-10-CM | POA: Diagnosis not present

## 2019-01-22 DIAGNOSIS — R339 Retention of urine, unspecified: Secondary | ICD-10-CM | POA: Diagnosis not present

## 2019-01-22 DIAGNOSIS — F0391 Unspecified dementia with behavioral disturbance: Secondary | ICD-10-CM | POA: Diagnosis not present

## 2019-01-22 DIAGNOSIS — Z87891 Personal history of nicotine dependence: Secondary | ICD-10-CM | POA: Diagnosis not present

## 2019-01-22 DIAGNOSIS — Z960 Presence of urogenital implants: Secondary | ICD-10-CM

## 2019-01-22 DIAGNOSIS — J302 Other seasonal allergic rhinitis: Secondary | ICD-10-CM | POA: Diagnosis not present

## 2019-01-22 DIAGNOSIS — Z466 Encounter for fitting and adjustment of urinary device: Secondary | ICD-10-CM | POA: Diagnosis not present

## 2019-01-22 NOTE — Progress Notes (Signed)
This service is provided via telemedicine  No vital signs collected/recorded due to the encounter was a telemedicine visit.   Location of patient (ex: home, work):  Home  Patient consents to a telephone visit:  Yes  Location of the provider (ex: office, home):  [facility], Office   Name of any referring provider: N/A  Names of all persons participating in the telemedicine service and their role in the encounter: S.Chrae B/CMA, Sherrie Mustache, NP, Mrs.Kellie Simmering, Daughter in Sports coach, Claiborne Billings and Patient   Time spent on call:  5 min with medical assistant      Careteam: Patient Care Team: [name], NP as PCP - General (Geriatric Medicine)  Advanced Directive information    Allergies  Allergen Reactions  . Codeine Other (See Comments)    Altered mental status  . Penicillins     Did it involve swelling of the face/tongue/throat, SOB, or low BP? Unknown Did it involve sudden or severe rash/hives, skin peeling, or any reaction on the inside of your mouth or nose? Unknown Did you need to seek medical attention at a hospital or doctor's office? Unknown When did it last happen? 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     Chief Complaint  Patient presents with  . Acute Visit    Memory concerns. Telephone visit      HPI: Patient is a 85 y.o. male who has memory concerns.  Pt with hx of dementia and home health nurse stated that he may need a new medication.  He was namenda in the past but did not help. They were concerned about side effects and not wanting to be on medication.  He does not like to take medication. Feels like "it is something bad" Family does not wish for him to be on medication.  It upsets him when he tires to take medication.  "you are trying to poison me"  Wife would like the home  health to take over changing catheter.  Specialist co-pay is high and coming to [place] is hard  Continues to mess with catheter and takes the bag off.   Review of Systems:  Review of Systems  Unable to perform ROS: Dementia    Past Medical History:  Diagnosis Date  . 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 Flu 1999   Hospitalized by Dr. Lenord Fellers IM   Past Surgical History:  Procedure Laterality Date  . HERNIA REPAIR    . KIDNEY STONE SURGERY     Basket extraction by Dr. Michela Pitcher    Social History:   reports that he quit smoking about 45 years ago. His smoking use included cigarettes. He has a 25.00 pack-year smoking history. He has never used smokeless tobacco. He reports previous alcohol use. He reports that he does not use drugs.  Family History  Problem Relation Age of Onset  . Pneumonia Mother   . Cancer Brother   . Brain cancer Brother   . Alzheimer's disease Sister   . Breast cancer Sister     Medications: Patient's Medications  New Prescriptions   No medications on file  Previous Medications   APIXABAN (ELIQUIS) 5 MG TABS TABLET    Take 5 mg by mouth 2 (two) times daily.   DIPHENHYDRAMINE (BENADRYL) 50 MG TABLET    Take 50 mg by mouth as needed  for allergies.   LORATADINE (CLARITIN) 10 MG TABLET    Take 10 mg by mouth as needed for allergies.   UNABLE TO FIND    Order: Urinalysis, culture and sensitivity. Fax results to [phone] Attention Alexsys Eskin  Modified Medications   No medications on file  Discontinued Medications   APIXABAN (ELIQUIS) 5 MG TABS TABLET    10 mg by mouth twice daily for 7 days then to start 5 mg by mouth twice daily for DVT   CIPROFLOXACIN (CIPRO) 500 MG TABLET    Take 1 tablet (500 mg total) by mouth 2 (two) times daily.    Physical Exam:  There were no vitals filed for this visit. There is no height or weight on file to calculate BMI. Wt Readings from Last 3 Encounters:  12/27/18 154 lb (69.9 kg)  12/24/18 155 lb 12.8 oz (70.7  kg)  11/11/18 140 lb (63.5 kg)      Labs reviewed: Basic Metabolic Panel: Recent Labs    09/27/18 1447 11/08/18 11/11/18 1714 12/24/18 1053  NA 139 140 143 139  K 4.7 6.1* 4.2 4.8  CL 101  --  109 103  CO2 29  --  27 31  GLUCOSE 83  --  122* 81  BUN 22 85* 30* 23  CREATININE 1.21* 7.3* 1.29* 1.19*  CALCIUM 9.4  --  8.6* 9.4   Liver Function Tests: Recent Labs    09/27/18 1447 11/11/18 1714  AST 19 22  ALT 12 23  ALKPHOS  --  64  BILITOT 1.1 0.5  PROT 7.2 6.9  ALBUMIN  --  3.1*   No results for input(s): LIPASE, AMYLASE in the last 8760 hours. No results for input(s): AMMONIA in the last 8760 hours. CBC: Recent Labs    11/11/18 1714 12/24/18 1053 12/27/18 1159  WBC 16.4* 9.2 10.8  NEUTROABS 11.7* 5,097 6,275  HGB 11.9* 12.7* 12.8*  HCT 39.6 40.0 39.7  MCV 93.6 87.7 86.7  PLT 260 255 267   Lipid Panel: No results for input(s): CHOL, HDL, LDLCALC, TRIG, CHOLHDL, LDLDIRECT in the last 8760 hours. TSH: No results for input(s): TSH in the last 8760 hours. A1C: No results found for: HGBA1C   Assessment/Plan 1. Chronic indwelling Foley catheter Stable. Having to go to urologist for exchange, request home health to do this. They plan to follow up with urologist and make sure they are okay with having [facility] do cath exchange.   2. Urinary retention Ongoing, has chronic foley catheter and followed by urology.    3. Dementia without behavioral disturbance, unspecified dementia type ([place]) -progressive decline. Tired to give namenda in the past but pt was very resist to medication. Education provided on namenda and aricept and at this time they would like to hold off on medication as giving pt pills is a battle and makes pt agitated. Continues with supportive care from family   [name]. Dj Senteno, [facility] [phone]    Virtual Visit via Telephone Note  I connected with pt, wife and daughter in law on 01/22/19 at  3:15 PM  EDT by telephone and verified that I am speaking with the correct person using two identifiers.  Location: Patient: home Provider: office    I discussed the limitations, risks, security and privacy concerns of performing an evaluation and management service by telephone and the availability of in person appointments. I also discussed with the patient that there may be a patient responsible charge related  to this service. The patient expressed understanding and agreed to proceed.   I discussed the assessment and treatment plan with the patient. The patient was provided an opportunity to ask questions and all were answered. The patient agreed with the plan and demonstrated an understanding of the instructions.   The patient was advised to call back or seek an in-person evaluation if the symptoms worsen or if the condition fails to improve as anticipated.  I provided 10  minutes of non-face-to-face time during this encounter.  [name]. Harle Battiest Avs printed and mailed

## 2019-01-27 ENCOUNTER — Telehealth: Payer: Self-pay | Admitting: *Deleted

## 2019-01-27 DIAGNOSIS — M6281 Muscle weakness (generalized): Secondary | ICD-10-CM | POA: Diagnosis not present

## 2019-01-27 DIAGNOSIS — J302 Other seasonal allergic rhinitis: Secondary | ICD-10-CM | POA: Diagnosis not present

## 2019-01-27 DIAGNOSIS — Z466 Encounter for fitting and adjustment of urinary device: Secondary | ICD-10-CM | POA: Diagnosis not present

## 2019-01-27 DIAGNOSIS — F0391 Unspecified dementia with behavioral disturbance: Secondary | ICD-10-CM | POA: Diagnosis not present

## 2019-01-27 DIAGNOSIS — Z87891 Personal history of nicotine dependence: Secondary | ICD-10-CM | POA: Diagnosis not present

## 2019-01-27 DIAGNOSIS — R339 Retention of urine, unspecified: Secondary | ICD-10-CM | POA: Diagnosis not present

## 2019-01-27 NOTE — Telephone Encounter (Signed)
It is okay to reschedule labs until next week if they are more comfortable with that.

## 2019-01-27 NOTE — Telephone Encounter (Signed)
Patient daughter, Roger Gardner called and stated that patient has a lab appointment tomorrow for labs. Daughter, Roger Gardner that brings patient to appointments was in [facility] 10 days ago and the family is NOT wanting her to go around the parents. Stated that she is not showing any symptoms. Wants to know if an order can be given to [facility] to draw the blood or if she needs to reschedule or if it is ok now to be around them and bring him to the appointment. Please Advise.

## 2019-01-27 NOTE — Telephone Encounter (Signed)
Patient daughter, [name] notified and will call back to reschedule the appointment.

## 2019-01-28 ENCOUNTER — Other Ambulatory Visit: Payer: Medicare Other

## 2019-01-28 DIAGNOSIS — M6281 Muscle weakness (generalized): Secondary | ICD-10-CM | POA: Diagnosis not present

## 2019-01-28 DIAGNOSIS — F0391 Unspecified dementia with behavioral disturbance: Secondary | ICD-10-CM | POA: Diagnosis not present

## 2019-01-28 DIAGNOSIS — J302 Other seasonal allergic rhinitis: Secondary | ICD-10-CM | POA: Diagnosis not present

## 2019-01-28 DIAGNOSIS — R339 Retention of urine, unspecified: Secondary | ICD-10-CM | POA: Diagnosis not present

## 2019-01-28 DIAGNOSIS — Z87891 Personal history of nicotine dependence: Secondary | ICD-10-CM | POA: Diagnosis not present

## 2019-01-28 DIAGNOSIS — Z466 Encounter for fitting and adjustment of urinary device: Secondary | ICD-10-CM | POA: Diagnosis not present

## 2019-01-29 DIAGNOSIS — M6281 Muscle weakness (generalized): Secondary | ICD-10-CM | POA: Diagnosis not present

## 2019-01-29 DIAGNOSIS — J302 Other seasonal allergic rhinitis: Secondary | ICD-10-CM | POA: Diagnosis not present

## 2019-01-29 DIAGNOSIS — F0391 Unspecified dementia with behavioral disturbance: Secondary | ICD-10-CM | POA: Diagnosis not present

## 2019-01-29 DIAGNOSIS — Z87891 Personal history of nicotine dependence: Secondary | ICD-10-CM | POA: Diagnosis not present

## 2019-01-29 DIAGNOSIS — R339 Retention of urine, unspecified: Secondary | ICD-10-CM | POA: Diagnosis not present

## 2019-01-29 DIAGNOSIS — Z466 Encounter for fitting and adjustment of urinary device: Secondary | ICD-10-CM | POA: Diagnosis not present

## 2019-01-30 DIAGNOSIS — R339 Retention of urine, unspecified: Secondary | ICD-10-CM | POA: Diagnosis not present

## 2019-01-30 DIAGNOSIS — Z466 Encounter for fitting and adjustment of urinary device: Secondary | ICD-10-CM | POA: Diagnosis not present

## 2019-01-30 DIAGNOSIS — Z87891 Personal history of nicotine dependence: Secondary | ICD-10-CM | POA: Diagnosis not present

## 2019-01-30 DIAGNOSIS — F0391 Unspecified dementia with behavioral disturbance: Secondary | ICD-10-CM | POA: Diagnosis not present

## 2019-01-30 DIAGNOSIS — J302 Other seasonal allergic rhinitis: Secondary | ICD-10-CM | POA: Diagnosis not present

## 2019-01-30 DIAGNOSIS — M6281 Muscle weakness (generalized): Secondary | ICD-10-CM | POA: Diagnosis not present

## 2019-02-01 ENCOUNTER — Telehealth: Payer: Self-pay | Admitting: Internal Medicine

## 2019-02-01 ENCOUNTER — Encounter: Payer: Self-pay | Admitting: Internal Medicine

## 2019-02-01 DIAGNOSIS — F0391 Unspecified dementia with behavioral disturbance: Secondary | ICD-10-CM | POA: Diagnosis not present

## 2019-02-01 DIAGNOSIS — N390 Urinary tract infection, site not specified: Secondary | ICD-10-CM

## 2019-02-01 DIAGNOSIS — R339 Retention of urine, unspecified: Secondary | ICD-10-CM | POA: Diagnosis not present

## 2019-02-01 DIAGNOSIS — Z87891 Personal history of nicotine dependence: Secondary | ICD-10-CM | POA: Diagnosis not present

## 2019-02-01 DIAGNOSIS — J302 Other seasonal allergic rhinitis: Secondary | ICD-10-CM | POA: Diagnosis not present

## 2019-02-01 DIAGNOSIS — Z466 Encounter for fitting and adjustment of urinary device: Secondary | ICD-10-CM | POA: Diagnosis not present

## 2019-02-01 DIAGNOSIS — M6281 Muscle weakness (generalized): Secondary | ICD-10-CM | POA: Diagnosis not present

## 2019-02-01 MED ORDER — CIPRO 500 MG/5ML (10%) PO SUSR: 500.0000 mg | Freq: Two times a day (BID) | ORAL | 0 refills | Status: AC

## 2019-02-01 NOTE — Telephone Encounter (Signed)
Roger Gardner is having dark, foul-smelling urine and his behavior has changed.  He was out for a ride with family to the [place] yesterday where he grew up b/c he requested to go home.  On the way home, he got very fussy, didn't want to eat his dinner or drink fluids.  This am, he was very grouchy with his wife and keeps pulling on his foley catheter.  He's on eliquis for DVT.  His daughter, [name], is concerned he has a UTI.  She had already called home health and they said they could change his catheter and obtain a sample for UA c+s, but needed an order.  She also had contacted urgent care, but they needed details about the cathter type in order to change the catheter which she could not provide (???).  [name] also notes that he has some difficulty with swallowing and did not do well to have the large pills he had last time (cipro tablets).  He is allergic to PCN and codeine.  They request a liquid antibiotic.    I called [facility] at [phone] and spoke with Shelondra (sp?) from on call and she got me in touch with the on call nurse, Orlinda Blalock.  At 11am, she said she would take care of changing out his foley catheter and obtaining his UA c+s to be sent out.  I will send in a liquid abx to [facility].  His Creatine clearance by cockcroft-gault is 36.76.  Will prescribe cipro suspension 500mg  po q12h x 10 days for complicated UTI.  Already advised to push fluids.  Await c+s.

## 2019-02-03 ENCOUNTER — Telehealth: Payer: Self-pay

## 2019-02-03 NOTE — Telephone Encounter (Signed)
Incoming fax received from [facility] to initiate PA for Cipro 500 mg/5ML(10%) Suspension  PA completed through [employer]  Key: [id number]  Awaiting response from [facility]

## 2019-02-04 ENCOUNTER — Other Ambulatory Visit: Payer: Medicare Other

## 2019-02-04 ENCOUNTER — Other Ambulatory Visit: Payer: Self-pay

## 2019-02-04 DIAGNOSIS — Z743 Need for continuous supervision: Secondary | ICD-10-CM | POA: Diagnosis not present

## 2019-02-04 DIAGNOSIS — F0391 Unspecified dementia with behavioral disturbance: Secondary | ICD-10-CM | POA: Diagnosis not present

## 2019-02-04 DIAGNOSIS — M6281 Muscle weakness (generalized): Secondary | ICD-10-CM | POA: Diagnosis not present

## 2019-02-04 DIAGNOSIS — R41841 Cognitive communication deficit: Secondary | ICD-10-CM | POA: Diagnosis not present

## 2019-02-04 DIAGNOSIS — J302 Other seasonal allergic rhinitis: Secondary | ICD-10-CM | POA: Diagnosis not present

## 2019-02-04 DIAGNOSIS — I824Y1 Acute embolism and thrombosis of unspecified deep veins of right proximal lower extremity: Secondary | ICD-10-CM | POA: Diagnosis not present

## 2019-02-04 DIAGNOSIS — R339 Retention of urine, unspecified: Secondary | ICD-10-CM | POA: Diagnosis not present

## 2019-02-04 DIAGNOSIS — Z466 Encounter for fitting and adjustment of urinary device: Secondary | ICD-10-CM | POA: Diagnosis not present

## 2019-02-04 DIAGNOSIS — Z87891 Personal history of nicotine dependence: Secondary | ICD-10-CM | POA: Diagnosis not present

## 2019-02-04 DIAGNOSIS — R296 Repeated falls: Secondary | ICD-10-CM | POA: Diagnosis not present

## 2019-02-04 LAB — BASIC METABOLIC PANEL WITH GFR
BUN/Creatinine Ratio: 18 (calc) (ref 6–22)
BUN: 22 mg/dL (ref 7–25)
CO2: 28 mmol/L (ref 20–32)
Calcium: 9.2 mg/dL (ref 8.6–10.3)
Chloride: 104 mmol/L (ref 98–110)
Creat: 1.22 mg/dL — ABNORMAL HIGH (ref 0.70–1.11)
GFR, Est African American: 58 mL/min/{1.73_m2} — ABNORMAL LOW (ref >=60)
GFR, Est Non African American: 50 mL/min/{1.73_m2} — ABNORMAL LOW (ref >=60)
Glucose, Bld: 83 mg/dL (ref 65–99)
Potassium: 4.5 mmol/L (ref 3.5–5.3)
Sodium: 139 mmol/L (ref 135–146)

## 2019-02-04 LAB — CBC WITH DIFFERENTIAL/PLATELET
Absolute Monocytes: 1131 cells/uL — ABNORMAL HIGH (ref 200–950)
Basophils Absolute: 65 cells/uL (ref 0–200)
Basophils Relative: 0.5 %
Eosinophils Absolute: 338 cells/uL (ref 15–500)
Eosinophils Relative: 2.6 %
HCT: 39.7 % (ref 38.5–50.0)
Hemoglobin: 12.7 g/dL — ABNORMAL LOW (ref 13.2–17.1)
Lymphs Abs: 3419 cells/uL (ref 850–3900)
MCH: 27.7 pg (ref 27.0–33.0)
MCHC: 32 g/dL (ref 32.0–36.0)
MCV: 86.5 fL (ref 80.0–100.0)
MPV: 10.1 fL (ref 7.5–12.5)
Monocytes Relative: 8.7 %
Neutro Abs: 8047 cells/uL — ABNORMAL HIGH (ref 1500–7800)
Neutrophils Relative %: 61.9 %
Platelets: 258 10*3/uL (ref 140–400)
RBC: 4.59 10*6/uL (ref 4.20–5.80)
RDW: 12.9 % (ref 11.0–15.0)
Total Lymphocyte: 26.3 %
WBC: 13 10*3/uL — ABNORMAL HIGH (ref 3.8–10.8)

## 2019-02-04 NOTE — Telephone Encounter (Signed)
Received Fax from [facility] and Cipro Suspension was APPROVED through 07/24/2019.   Ref#: [id number] ID: [id number]  Spoke with Sam at [facility]

## 2019-02-05 ENCOUNTER — Telehealth: Payer: Self-pay | Admitting: *Deleted

## 2019-02-05 DIAGNOSIS — J302 Other seasonal allergic rhinitis: Secondary | ICD-10-CM | POA: Diagnosis not present

## 2019-02-05 DIAGNOSIS — M6281 Muscle weakness (generalized): Secondary | ICD-10-CM | POA: Diagnosis not present

## 2019-02-05 DIAGNOSIS — F0391 Unspecified dementia with behavioral disturbance: Secondary | ICD-10-CM | POA: Diagnosis not present

## 2019-02-05 DIAGNOSIS — Z87891 Personal history of nicotine dependence: Secondary | ICD-10-CM | POA: Diagnosis not present

## 2019-02-05 DIAGNOSIS — Z466 Encounter for fitting and adjustment of urinary device: Secondary | ICD-10-CM | POA: Diagnosis not present

## 2019-02-05 DIAGNOSIS — R339 Retention of urine, unspecified: Secondary | ICD-10-CM | POA: Diagnosis not present

## 2019-02-05 NOTE — Telephone Encounter (Signed)
Roger Gardner with [facility] called requesting verbal orders to change patient's Cath monthly. Patient was going to [facility] but wife does not want to take patient there. Nurse is willing to change with order. Please Advise.

## 2019-02-05 NOTE — Telephone Encounter (Signed)
[facility] nurse may change patient's indwelling Foley catheter monthly per patient's request.

## 2019-02-05 NOTE — Telephone Encounter (Signed)
LMOM to return call.

## 2019-02-06 NOTE — Telephone Encounter (Signed)
Kathlee Nations Notified.

## 2019-02-06 NOTE — Telephone Encounter (Signed)
LMOM to return call.

## 2019-02-07 ENCOUNTER — Telehealth: Payer: Self-pay | Admitting: *Deleted

## 2019-02-07 DIAGNOSIS — Z87891 Personal history of nicotine dependence: Secondary | ICD-10-CM | POA: Diagnosis not present

## 2019-02-07 DIAGNOSIS — F0391 Unspecified dementia with behavioral disturbance: Secondary | ICD-10-CM | POA: Diagnosis not present

## 2019-02-07 DIAGNOSIS — J302 Other seasonal allergic rhinitis: Secondary | ICD-10-CM | POA: Diagnosis not present

## 2019-02-07 DIAGNOSIS — M6281 Muscle weakness (generalized): Secondary | ICD-10-CM | POA: Diagnosis not present

## 2019-02-07 DIAGNOSIS — R339 Retention of urine, unspecified: Secondary | ICD-10-CM | POA: Diagnosis not present

## 2019-02-07 DIAGNOSIS — Z466 Encounter for fitting and adjustment of urinary device: Secondary | ICD-10-CM | POA: Diagnosis not present

## 2019-02-07 NOTE — Telephone Encounter (Signed)
Calling pt's wife with urinalysis results, per Dr Mariea Clonts "final results showed a ton of blood cells and bacteria not just one"   Per wife pt is feeling better and his urine has cleared up.

## 2019-02-12 DIAGNOSIS — J302 Other seasonal allergic rhinitis: Secondary | ICD-10-CM | POA: Diagnosis not present

## 2019-02-12 DIAGNOSIS — Z87891 Personal history of nicotine dependence: Secondary | ICD-10-CM | POA: Diagnosis not present

## 2019-02-12 DIAGNOSIS — F0391 Unspecified dementia with behavioral disturbance: Secondary | ICD-10-CM | POA: Diagnosis not present

## 2019-02-12 DIAGNOSIS — M6281 Muscle weakness (generalized): Secondary | ICD-10-CM | POA: Diagnosis not present

## 2019-02-12 DIAGNOSIS — R339 Retention of urine, unspecified: Secondary | ICD-10-CM | POA: Diagnosis not present

## 2019-02-12 DIAGNOSIS — Z466 Encounter for fitting and adjustment of urinary device: Secondary | ICD-10-CM | POA: Diagnosis not present

## 2019-02-13 DIAGNOSIS — F0391 Unspecified dementia with behavioral disturbance: Secondary | ICD-10-CM | POA: Diagnosis not present

## 2019-02-13 DIAGNOSIS — Z87891 Personal history of nicotine dependence: Secondary | ICD-10-CM | POA: Diagnosis not present

## 2019-02-13 DIAGNOSIS — J302 Other seasonal allergic rhinitis: Secondary | ICD-10-CM | POA: Diagnosis not present

## 2019-02-13 DIAGNOSIS — Z466 Encounter for fitting and adjustment of urinary device: Secondary | ICD-10-CM | POA: Diagnosis not present

## 2019-02-13 DIAGNOSIS — R339 Retention of urine, unspecified: Secondary | ICD-10-CM | POA: Diagnosis not present

## 2019-02-13 DIAGNOSIS — M6281 Muscle weakness (generalized): Secondary | ICD-10-CM | POA: Diagnosis not present

## 2019-02-18 DIAGNOSIS — Z87891 Personal history of nicotine dependence: Secondary | ICD-10-CM | POA: Diagnosis not present

## 2019-02-18 DIAGNOSIS — M6281 Muscle weakness (generalized): Secondary | ICD-10-CM | POA: Diagnosis not present

## 2019-02-18 DIAGNOSIS — Z466 Encounter for fitting and adjustment of urinary device: Secondary | ICD-10-CM | POA: Diagnosis not present

## 2019-02-18 DIAGNOSIS — F0391 Unspecified dementia with behavioral disturbance: Secondary | ICD-10-CM | POA: Diagnosis not present

## 2019-02-18 DIAGNOSIS — J302 Other seasonal allergic rhinitis: Secondary | ICD-10-CM | POA: Diagnosis not present

## 2019-02-18 DIAGNOSIS — R339 Retention of urine, unspecified: Secondary | ICD-10-CM | POA: Diagnosis not present

## 2019-02-20 DIAGNOSIS — Z466 Encounter for fitting and adjustment of urinary device: Secondary | ICD-10-CM | POA: Diagnosis not present

## 2019-02-20 DIAGNOSIS — Z87891 Personal history of nicotine dependence: Secondary | ICD-10-CM | POA: Diagnosis not present

## 2019-02-20 DIAGNOSIS — F0391 Unspecified dementia with behavioral disturbance: Secondary | ICD-10-CM | POA: Diagnosis not present

## 2019-02-20 DIAGNOSIS — J302 Other seasonal allergic rhinitis: Secondary | ICD-10-CM | POA: Diagnosis not present

## 2019-02-20 DIAGNOSIS — R339 Retention of urine, unspecified: Secondary | ICD-10-CM | POA: Diagnosis not present

## 2019-02-20 DIAGNOSIS — M6281 Muscle weakness (generalized): Secondary | ICD-10-CM | POA: Diagnosis not present

## 2019-02-24 DIAGNOSIS — Z87891 Personal history of nicotine dependence: Secondary | ICD-10-CM | POA: Diagnosis not present

## 2019-02-24 DIAGNOSIS — M6281 Muscle weakness (generalized): Secondary | ICD-10-CM | POA: Diagnosis not present

## 2019-02-24 DIAGNOSIS — Z466 Encounter for fitting and adjustment of urinary device: Secondary | ICD-10-CM | POA: Diagnosis not present

## 2019-02-24 DIAGNOSIS — J302 Other seasonal allergic rhinitis: Secondary | ICD-10-CM | POA: Diagnosis not present

## 2019-02-24 DIAGNOSIS — R339 Retention of urine, unspecified: Secondary | ICD-10-CM | POA: Diagnosis not present

## 2019-02-24 DIAGNOSIS — F0391 Unspecified dementia with behavioral disturbance: Secondary | ICD-10-CM | POA: Diagnosis not present

## 2019-02-27 DIAGNOSIS — R339 Retention of urine, unspecified: Secondary | ICD-10-CM | POA: Diagnosis not present

## 2019-02-27 DIAGNOSIS — Z466 Encounter for fitting and adjustment of urinary device: Secondary | ICD-10-CM | POA: Diagnosis not present

## 2019-02-27 DIAGNOSIS — J302 Other seasonal allergic rhinitis: Secondary | ICD-10-CM | POA: Diagnosis not present

## 2019-02-27 DIAGNOSIS — M6281 Muscle weakness (generalized): Secondary | ICD-10-CM | POA: Diagnosis not present

## 2019-02-27 DIAGNOSIS — F0391 Unspecified dementia with behavioral disturbance: Secondary | ICD-10-CM | POA: Diagnosis not present

## 2019-02-27 DIAGNOSIS — Z87891 Personal history of nicotine dependence: Secondary | ICD-10-CM | POA: Diagnosis not present

## 2019-03-03 ENCOUNTER — Other Ambulatory Visit: Payer: Medicare Other

## 2019-03-04 ENCOUNTER — Encounter: Payer: Self-pay | Admitting: Family

## 2019-03-04 ENCOUNTER — Telehealth: Payer: Self-pay | Admitting: *Deleted

## 2019-03-04 ENCOUNTER — Other Ambulatory Visit: Payer: Self-pay

## 2019-03-04 ENCOUNTER — Ambulatory Visit (INDEPENDENT_AMBULATORY_CARE_PROVIDER_SITE_OTHER): Payer: Medicare Other | Admitting: Family

## 2019-03-04 DIAGNOSIS — R2681 Unsteadiness on feet: Secondary | ICD-10-CM

## 2019-03-04 DIAGNOSIS — R531 Weakness: Secondary | ICD-10-CM

## 2019-03-04 DIAGNOSIS — R399 Unspecified symptoms and signs involving the genitourinary system: Secondary | ICD-10-CM

## 2019-03-04 NOTE — Telephone Encounter (Signed)
Kathlee Nations with [facility] notified and agreed. Stated that she is seeing patient tomorrow and will collect urine.

## 2019-03-04 NOTE — Telephone Encounter (Signed)
-----   Message from Sandrea Hughs, NP sent at 03/04/2019  3:51 PM EDT ----- Regarding: [place], Please call [facility] to collect urine specimen as soon as possible.Order already faxed by CMA.Physical Therapy also ordered to for unsteady gait and weakness. Thank You  Dinah Ngetich FNP-C

## 2019-03-04 NOTE — Progress Notes (Signed)
Provider:   FNP-C  [name], NP  Patient Care Team: [name], NP as PCP - General (Geriatric Medicine)  Extended Emergency Contact Information Primary Emergency Contact: Rolph,BETTY Address: [address]          [place],  [place] Home Phone: [phone] Relation: None Secondary Emergency Contact: [name] Mobile Phone: [phone] Relation: Daughter  Code Status:  Goals of care: Advanced Directive information Advanced Directives 11/11/2018  Does Patient Have a Medical Advance Directive? Yes  Type of Advance Directive [place]  Does patient want to make changes to medical advance directive? -  Copy of [place] in Chart? Yes - validated most recent copy scanned in chart (See row information)   This service is provided via telemedicine  No vital signs collected/recorded due to the encounter was a telemedicine visit.   Location of patient (ex: home, work):  Home  Patient consents to a telephone visit:  yes  Location of the provider (ex: office, home):  Office   Name of any referring provider:  Sherrie Mustache, NP   Names of all persons participating in the telemedicine service and their role in the encounter:    NP, Ruthell Rummage CMA, Lacey Jensen, [name]   Time spent on call:  Ruthell Rummage CMA, spent 8 minutes on phone with patient    Chief Complaint  Patient presents with  . Acute Visit    Complains of leg weakness and possible uti, would like order for home health to collect urine duration of a week   . Medication Management    Daughter states patient has hard time swallowing cipro     HPI:  Pt is a 85 y.o. male seen today  for an acute visit for evaluation of leg weakness and possible urinary tract infections. HPI information provided by Patient's wife patient unable to provide information due to cognitive impairment.Patient's wife reports patient has had some  generalized weakness not able to stand up " He just wants to slid to edge of the bed then tries to slide down on the floor.His urine has more darker than usual.He does not like to drink water.He has been more confused.No agitation noted.she has not checked his temperature but vital signs has been within normal range when last checked by home health Nurse.Wife request HHN to obtain urine specimen to check for UTI.she states whenever patient acts like this he usually has a UTI.His appetite has been good.No nausea or vomiting.patient does not usually communicate any needs.     Past Medical History:  Diagnosis Date  . 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 Flu 1999   Hospitalized by Dr. Lenord Fellers IM   Past Surgical History:  Procedure Laterality Date  . HERNIA REPAIR    . KIDNEY STONE SURGERY     Basket extraction by Dr. Michela Pitcher     Allergies  Allergen Reactions  . Codeine Other (See Comments)    Altered mental status  . Penicillins     Did it involve swelling of the face/tongue/throat, SOB, or low BP? Unknown Did it involve sudden or severe rash/hives, skin peeling, or any reaction on the inside of your mouth or nose? Unknown Did you need to seek medical attention at a hospital or doctor's office? Unknown When did it last happen? 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     Outpatient Encounter Medications as of 03/04/2019  Medication Sig  . apixaban (ELIQUIS) 5 MG TABS tablet Take 5 mg by mouth 2 (two) times daily.  . diphenhydrAMINE (BENADRYL) 50 MG tablet Take 50 mg by mouth as needed for allergies.  Marland Kitchen loratadine (CLARITIN) 10 MG tablet Take 10 mg by mouth as needed for allergies.  Marland Kitchen UNABLE TO FIND Order: Urinalysis, culture and sensitivity. Fax results to [phone] Attention Janett Billow   No facility-administered encounter  medications on file as of 03/04/2019.     Review of Systems  Constitutional: Negative for appetite change, chills, fatigue and fever.  HENT: Positive for congestion, rhinorrhea and sneezing. Negative for sinus pressure, sinus pain and sore throat.        Claritin given today   Eyes: Negative for pain, discharge, redness and itching.  Respiratory: Negative for cough, chest tightness, shortness of breath and wheezing.   Cardiovascular: Negative for chest pain, palpitations and leg swelling.  Gastrointestinal: Negative for abdominal distention, abdominal pain, constipation, diarrhea, nausea and vomiting.  Genitourinary: Negative for hematuria.       Has indwelling foley catheter.wife reports dark urine.Does not drink enough.  Musculoskeletal: Positive for gait problem.  Skin: Negative for color change, pallor and rash.  Neurological: Negative for dizziness, light-headedness and headaches.  Psychiatric/Behavioral: Positive for confusion. Negative for agitation and sleep disturbance. The patient is not nervous/anxious.      There is no immunization history on file for this patient. Pertinent  Health Maintenance Due  Topic Date Due  . INFLUENZA VACCINE  02/22/2019  . PNA vac Low Risk Adult (1 of 2 - PCV13) 09/27/2019 (Originally 10/08/1986)   Fall Risk  03/04/2019 01/22/2019 12/24/2018 12/20/2018 11/12/2018  Falls in the past year? 1 1 1  0 1  Number falls in past yr: 1 1 1  0 1  Injury with Fall? 0 0 0 0 0  Risk for fall due to : - - History of fall(s) - History of fall(s)   Functional Status Survey:    There were no vitals filed for this visit. There is no height or weight on file to calculate BMI. Physical Exam  Unable to complete on telephone visit.   Labs reviewed: Recent Labs    11/11/18 1714 12/24/18 1053 02/04/19 1016  NA 143 139 139  K 4.2 4.8 4.5  CL 109 103 104  CO2 27 31 28   GLUCOSE 122* 81 83  BUN 30* 23 22  CREATININE 1.29* 1.19* 1.22*  CALCIUM 8.6* 9.4 9.2    Recent Labs    09/27/18 1447 11/11/18 1714  AST 19 22  ALT 12 23  ALKPHOS  --  64  BILITOT 1.1 0.5  PROT 7.2 6.9  ALBUMIN  --  3.1*   Recent Labs    12/24/18 1053 12/27/18 1159 02/04/19 1016  WBC 9.2 10.8 13.0*  NEUTROABS 5,097 6,275 8,047*  HGB 12.7* 12.8* 12.7*  HCT 40.0 39.7 39.7  MCV 87.7 86.7 86.5  PLT 255 267 258   No results found for: TSH No results found for: HGBA1C No results found for: CHOL, HDL, LDLCALC, LDLDIRECT, TRIG, CHOLHDL  Significant Diagnostic Results in last 30 days:  No results found.  Assessment/Plan 1. Symptoms of urinary tract infection No temperature checked.Has had increased confusion and generalized weakness.Encouraged to increase small frequent fluids intake. - Order written for [facility] to collect Urine specimen for U/A and C/S to rule out UTI. Order faxed by [facility]  Lenna Sciara  Wilson CMA.  2. Generalized weakness Worsening weakness.Appetite reported as good.Possible UTI.U/a and C/s as above. Will have him work with Home health Physical Therapy for exercise,ROM,strenghtening and gait stability.  3. Unsteady gait Home health Physical Therapy for exercise,ROM,strenghtening and gait stability.  Family/ staff Communication: Reviewed plan of care with patient.  Labs/tests ordered:  Urine specimen for U/A and C/S to be collected stat by [facility]   Spent 14 minutes of non-face to face with patient    Sandrea Hughs, NP

## 2019-03-05 DIAGNOSIS — M6281 Muscle weakness (generalized): Secondary | ICD-10-CM | POA: Diagnosis not present

## 2019-03-05 DIAGNOSIS — J302 Other seasonal allergic rhinitis: Secondary | ICD-10-CM | POA: Diagnosis not present

## 2019-03-05 DIAGNOSIS — Z466 Encounter for fitting and adjustment of urinary device: Secondary | ICD-10-CM | POA: Diagnosis not present

## 2019-03-05 DIAGNOSIS — N390 Urinary tract infection, site not specified: Secondary | ICD-10-CM | POA: Diagnosis not present

## 2019-03-05 DIAGNOSIS — F0391 Unspecified dementia with behavioral disturbance: Secondary | ICD-10-CM | POA: Diagnosis not present

## 2019-03-05 DIAGNOSIS — R339 Retention of urine, unspecified: Secondary | ICD-10-CM | POA: Diagnosis not present

## 2019-03-05 DIAGNOSIS — Z87891 Personal history of nicotine dependence: Secondary | ICD-10-CM | POA: Diagnosis not present

## 2019-03-06 ENCOUNTER — Other Ambulatory Visit: Payer: Self-pay

## 2019-03-06 ENCOUNTER — Telehealth: Payer: Self-pay

## 2019-03-06 ENCOUNTER — Ambulatory Visit (INDEPENDENT_AMBULATORY_CARE_PROVIDER_SITE_OTHER): Payer: Medicare Other | Admitting: Nurse Practitioner

## 2019-03-06 DIAGNOSIS — Z466 Encounter for fitting and adjustment of urinary device: Secondary | ICD-10-CM | POA: Diagnosis not present

## 2019-03-06 DIAGNOSIS — R262 Difficulty in walking, not elsewhere classified: Secondary | ICD-10-CM

## 2019-03-06 DIAGNOSIS — R399 Unspecified symptoms and signs involving the genitourinary system: Secondary | ICD-10-CM | POA: Diagnosis not present

## 2019-03-06 DIAGNOSIS — Z87891 Personal history of nicotine dependence: Secondary | ICD-10-CM

## 2019-03-06 DIAGNOSIS — R339 Retention of urine, unspecified: Secondary | ICD-10-CM

## 2019-03-06 DIAGNOSIS — R531 Weakness: Secondary | ICD-10-CM

## 2019-03-06 DIAGNOSIS — Z743 Need for continuous supervision: Secondary | ICD-10-CM | POA: Diagnosis not present

## 2019-03-06 DIAGNOSIS — Z960 Presence of urogenital implants: Secondary | ICD-10-CM

## 2019-03-06 DIAGNOSIS — R41841 Cognitive communication deficit: Secondary | ICD-10-CM

## 2019-03-06 DIAGNOSIS — M6281 Muscle weakness (generalized): Secondary | ICD-10-CM | POA: Diagnosis not present

## 2019-03-06 DIAGNOSIS — R296 Repeated falls: Secondary | ICD-10-CM

## 2019-03-06 DIAGNOSIS — I824Y1 Acute embolism and thrombosis of unspecified deep veins of right proximal lower extremity: Secondary | ICD-10-CM

## 2019-03-06 DIAGNOSIS — F0391 Unspecified dementia with behavioral disturbance: Secondary | ICD-10-CM | POA: Diagnosis not present

## 2019-03-06 DIAGNOSIS — Z978 Presence of other specified devices: Secondary | ICD-10-CM

## 2019-03-06 MED ORDER — SULFAMETHOXAZOLE-TRIMETHOPRIM 800-160 MG PO TABS: 1.0000 | ORAL_TABLET | Freq: Two times a day (BID) | ORAL | 0 refills | Status: DC

## 2019-03-06 NOTE — Telephone Encounter (Signed)
[name] with [facility] called to report missed visit on 03/05/19. If needed she can be reached at [phone].   Thanks  [employer]

## 2019-03-06 NOTE — Telephone Encounter (Signed)
Patient has a telephone visit scheduled for today tried calling, no answer and not able to leave a voicemail. Will make 2 more attempts to try and reach the patient.

## 2019-03-06 NOTE — Progress Notes (Signed)
This service is provided via telemedicine  No vital signs collected/recorded due to the encounter was a telemedicine visit.   Location of patient (ex: home, work): patient home  Patient consents to a telephone visit: Yes  Location of the provider (ex: office, home): [place]  Name of any referring provider:Enos Muhl Dewaine Oats ,NP  Names of all persons participating in the telemedicine service and their role in the encounter: Tiffany Johnson CMA, [name] (daughter) artemio dobie (wife)  Time spent on call: 9MIN  Patient has a 4 month televist     Careteam: Patient Care Team: [name], NP as PCP - General (Geriatric Medicine)  Advanced Directive information Does Patient Have a Medical Advance Directive?: No, Would patient like information on creating a medical advance directive?: No - Patient declined  Allergies  Allergen Reactions  . Codeine Other (See Comments)    Altered mental status  . Penicillins     Did it involve swelling of the face/tongue/throat, SOB, or low BP? Unknown Did it involve sudden or severe rash/hives, skin peeling, or any reaction on the inside of your mouth or nose? Unknown Did you need to seek medical attention at a hospital or doctor's office? Unknown When did it last happen? 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     Chief Complaint  Patient presents with  . Medical Management of Chronic Issues    4 month routine follow, Urine abnormal color, urine strong odor, acting lethargic. not eating, still waiting on physical therapy     HPI: Patient is a 85 y.o. male for routine follow up.  Daughter providing hx. Reports she talked to [facility] NP because they have high suspicion of a UTI.  [facility] came and got urine sample through the catheter, he has had multiple UTIs since  he has had the catheter.  He acts like he does not feel well. Increase lethargy.  When [facility] came the nurse changed the bag and got the urine sample from that.  Urine has bad odor, dark amber.  He has overall weakness. Overall dead weight hard to get him up and to appts.  Trying to push water but he does not want to drink anything. Having to do a lot of prompting.  He was on cipro 500 mg BID for 10 days which improved symptoms but this last 3 weeks and now things are not good. Not satisfied with [facility]- hard to get in touch with them.   LE edema has improved significantly, redness and swelling down.    Review of Systems:  Review of Systems  Unable to perform ROS: Dementia    Past Medical History:  Diagnosis Date  . 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 Flu 1999   Hospitalized by Dr. Lenord Fellers IM   Past Surgical History:  Procedure Laterality Date  . HERNIA REPAIR    . KIDNEY STONE SURGERY     Basket extraction by Dr. Michela Pitcher    Social History:   reports that he quit smoking about 45 years ago. His smoking use included cigarettes. He has a 25.00 pack-year smoking history. He has never used smokeless tobacco. He reports previous alcohol use. He reports that he does not use drugs.  Family History  Problem Relation Age of Onset  . Pneumonia Mother   . Cancer Brother   .  Brain cancer Brother   . Alzheimer's disease Sister   . Breast cancer Sister     Medications: Patient's Medications  New Prescriptions   No medications on file  Previous Medications   APIXABAN (ELIQUIS) 5 MG TABS TABLET    Take 5 mg by mouth 2 (two) times daily.   DIPHENHYDRAMINE (BENADRYL) 50 MG TABLET    Take 50 mg by mouth as needed for allergies.   LORATADINE (CLARITIN) 10 MG TABLET    Take 10 mg by mouth as needed for allergies.   UNABLE TO FIND    Order: Urinalysis, culture and sensitivity. Fax results to [phone] Attention Anysha Frappier  Modified Medications   No medications on file   Discontinued Medications   No medications on file    Physical Exam:  There were no vitals filed for this visit. There is no height or weight on file to calculate BMI. Wt Readings from Last 3 Encounters:  12/27/18 154 lb (69.9 kg)  12/24/18 155 lb 12.8 oz (70.7 kg)  11/11/18 140 lb (63.5 kg)     Labs reviewed: Basic Metabolic Panel: Recent Labs    11/11/18 1714 12/24/18 1053 02/04/19 1016  NA 143 139 139  K 4.2 4.8 4.5  CL 109 103 104  CO2 27 31 28   GLUCOSE 122* 81 83  BUN 30* 23 22  CREATININE 1.29* 1.19* 1.22*  CALCIUM 8.6* 9.4 9.2   Liver Function Tests: Recent Labs    09/27/18 1447 11/11/18 1714  AST 19 22  ALT 12 23  ALKPHOS  --  64  BILITOT 1.1 0.5  PROT 7.2 6.9  ALBUMIN  --  3.1*   No results for input(s): LIPASE, AMYLASE in the last 8760 hours. No results for input(s): AMMONIA in the last 8760 hours. CBC: Recent Labs    12/24/18 1053 12/27/18 1159 02/04/19 1016  WBC 9.2 10.8 13.0*  NEUTROABS 5,097 6,275 8,047*  HGB 12.7* 12.8* 12.7*  HCT 40.0 39.7 39.7  MCV 87.7 86.7 86.5  PLT 255 267 258   Lipid Panel: No results for input(s): CHOL, HDL, LDLCALC, TRIG, CHOLHDL, LDLDIRECT in the last 8760 hours. TSH: No results for input(s): TSH in the last 8760 hours. A1C: No results found for: HGBA1C   Assessment/Plan 1. Symptoms of urinary tract infection -UA C&S sent however home health did not change foley (only the bag) when obtaining specimen. UA abnormal and culture pending however may be containment due to how culture was obtained.  -he has been on cipro twice over the last 2 months -due to symptoms of UTI with weakness and fatigue will treat - sulfamethoxazole-trimethoprim (BACTRIM DS) 800-160 MG tablet; Take 1 tablet by mouth 2 (two) times daily.  Dispense: 20 tablet; Refill: 0 -will have home health nursing check a UA C&S at the completion of the antibiotic -educated on proper catheter care and will get home health to reinforce.   2.  Generalized weakness Home health following, will treat for UTI. Encouraged to increase hydration. Proper nutrition encouraged.   3.chronic foley catheter Managed by home health who is changing every 30 days, will have them re-educate pt on proper care.  4. Urinary retention Now with chronic foley catheter   5. Acute deep vein thrombosis (DVT) of proximal vein of right lower extremity (HCC) Stable, continues on eliquis.    Next appt: 4 months  Afton Mikelson K. Harle Battiest  [facility] [phone]    Virtual Visit via Telephone Note  I connected with  pt, daughter and wife on 03/06/19 at 11:30 AM EDT by telephone and verified that I am speaking with the correct person using two identifiers.  Location: Patient: home Provider: office    I discussed the limitations, risks, security and privacy concerns of performing an evaluation and management service by telephone and the availability of in person appointments. I also discussed with the patient that there may be a patient responsible charge related to this service. The patient expressed understanding and agreed to proceed.   I discussed the assessment and treatment plan with the patient. The patient was provided an opportunity to ask questions and all were answered. The patient agreed with the plan and demonstrated an understanding of the instructions.   The patient was advised to call back or seek an in-person evaluation if the symptoms worsen or if the condition fails to improve as anticipated.  I provided 22 minutes of non-face-to-face time during this encounter.  [name]. Harle Battiest Avs printed and mailed

## 2019-03-06 NOTE — Patient Instructions (Signed)
Indwelling Urinary Catheter Care, Adult °An indwelling urinary catheter is a thin tube that is put into your bladder. The tube helps to drain pee (urine) out of your body. The tube goes in through your urethra. Your urethra is where pee comes out of your body. Your pee will come out through the catheter, then it will go into a bag (drainage bag). °Take good care of your catheter so it will work well. °How to wear your catheter and bag °Supplies needed °· Sticky tape (adhesive tape) or a leg strap. °· Alcohol wipe or soap and water (if you use tape). °· A clean towel (if you use tape). °· Large overnight bag. °· Smaller bag (leg bag). °Wearing your catheter °Attach your catheter to your leg with tape or a leg strap. °· Make sure the catheter is not pulled tight. °· If a leg strap gets wet, take it off and put on a dry strap. °· If you use tape to hold the bag on your leg: °1. Use an alcohol wipe or soap and water to wash your skin where the tape made it sticky before. °2. Use a clean towel to pat-dry that skin. °3. Use new tape to make the bag stay on your leg. °Wearing your bags °You should have been given a large overnight bag. °· You may wear the overnight bag in the day or night. °· Always have the overnight bag lower than your bladder.  Do not let the bag touch the floor. °· Before you go to sleep, put a clean plastic bag in a wastebasket. Then hang the overnight bag inside the wastebasket. °You should also have a smaller leg bag that fits under your clothes. °· Always wear the leg bag below your knee. °· Do not wear your leg bag at night. °How to care for your skin and catheter °Supplies needed °· A clean washcloth. °· Water and mild soap. °· A clean towel. °Caring for your skin and catheter ° °  ° °· Clean the skin around your catheter every day: °? Wash your hands with soap and water. °? Wet a clean washcloth in warm water and mild soap. °? Clean the skin around your urethra. °? If you are male: °? Gently  spread the folds of skin around your vagina (labia). °? With the washcloth in your other hand, wipe the inner side of your labia on each side. Wipe from front to back. °? If you are male: °? Pull back any skin that covers the end of your penis (foreskin). °? With the washcloth in your other hand, wipe your penis in small circles. Start wiping at the tip of your penis, then move away from the catheter. °? Move the foreskin back in place, if needed. °? With your free hand, hold the catheter close to where it goes into your body. °? Keep holding the catheter during cleaning so it does not get pulled out. °? With the washcloth in your other hand, clean the catheter. °? Only wipe downward on the catheter. °? Do not wipe upward toward your body. Doing this may push germs into your urethra and cause infection. °? Use a clean towel to pat-dry the catheter and the skin around it. Make sure to wipe off all soap. °? Wash your hands with soap and water. °· Shower every day. Do not take baths. °· Do not use cream, ointment, or lotion on the area where the catheter goes into your body, unless your doctor tells you   to. °· Do not use powders, sprays, or lotions on your genital area. °· Check your skin around the catheter every day for signs of infection. Check for: °? Redness, swelling, or pain. °? Fluid or blood. °? Warmth. °? Pus or a bad smell. °How to empty the bag °Supplies needed °· Rubbing alcohol. °· Gauze pad or cotton ball. °· Tape or a leg strap. °Emptying the bag °Pour the pee out of your bag when it is ?-½ full, or at least 2-3 times a day. Do this for your overnight bag and your leg bag. °1. Wash your hands with soap and water. °2. Separate (detach) the bag from your leg. °3. Hold the bag over the toilet or a clean pail. Keep the bag lower than your hips and bladder. This is so the pee (urine) does not go back into the tube. °4. Open the pour spout. It is at the bottom of the bag. °5. Empty the pee into the toilet or  pail. Do not let the pour spout touch any surface. °6. Put rubbing alcohol on a gauze pad or cotton ball. °7. Use the gauze pad or cotton ball to clean the pour spout. °8. Close the pour spout. °9. Attach the bag to your leg with tape or a leg strap. °10. Wash your hands with soap and water. °Follow instructions for cleaning the drainage bag: °· From the product maker. °· As told by your doctor. °How to change the bag °Supplies needed °· Alcohol wipes. °· A clean bag. °· Tape or a leg strap. °Changing the bag °Replace your bag when it starts to leak, smell bad, or look dirty. °1. Wash your hands with soap and water. °2. Separate the dirty bag from your leg. °3. Pinch the catheter with your fingers so that pee does not spill out. °4. Separate the catheter tube from the bag tube where these tubes connect (at the connection valve). Do not let the tubes touch any surface. °5. Clean the end of the catheter tube with an alcohol wipe. Use a different alcohol wipe to clean the end of the bag tube. °6. Connect the catheter tube to the tube of the clean bag. °7. Attach the clean bag to your leg with tape or a leg strap. Do not make the bag tight on your leg. °8. Wash your hands with soap and water. °General rules ° °· Never pull on your catheter. Never try to take it out. Doing that can hurt you. °· Always wash your hands before and after you touch your catheter or bag. Use a mild, fragrance-free soap. If you do not have soap and water, use hand sanitizer. °· Always make sure there are no twists or bends (kinks) in the catheter tube. °· Always make sure there are no leaks in the catheter or bag. °· Drink enough fluid to keep your pee pale yellow. °· Do not take baths, swim, or use a hot tub. °· If you are male, wipe from front to back after you poop (have a bowel movement). °Contact a doctor if: °· Your pee is cloudy. °· Your pee smells worse than usual. °· Your catheter gets clogged. °· Your catheter leaks. °· Your bladder  feels full. °Get help right away if: °· You have redness, swelling, or pain where the catheter goes into your body. °· You have fluid, blood, pus, or a bad smell coming from the area where the catheter goes into your body. °· Your skin feels warm where   the catheter goes into your body. °· You have a fever. °· You have pain in your: °? Belly (abdomen). °? Legs. °? Lower back. °? Bladder. °· You see blood in the catheter. °· Your pee is pink or red. °· You feel sick to your stomach (nauseous). °· You throw up (vomit). °· You have chills. °· Your pee is not draining into the bag. °· Your catheter gets pulled out. °Summary °· An indwelling urinary catheter is a thin tube that is placed into the bladder to help drain pee (urine) out of the body. °· The catheter is placed into the part of the body that drains pee from the bladder (urethra). °· Taking good care of your catheter will keep it working properly and help prevent problems. °· Always wash your hands before and after touching your catheter or bag. °· Never pull on your catheter or try to take it out. °This information is not intended to replace advice given to you by your health care provider. Make sure you discuss any questions you have with your health care provider. °Document Released: 11/04/2012 Document Revised: 11/01/2018 Document Reviewed: 02/23/2017 °Elsevier Patient Education © 2020 [employer] ° °

## 2019-03-07 NOTE — Telephone Encounter (Signed)
Patient had an televisit  appointment with Sherrie Mustache NP ON 03/06/2019

## 2019-03-10 DIAGNOSIS — R41841 Cognitive communication deficit: Secondary | ICD-10-CM | POA: Diagnosis not present

## 2019-03-10 DIAGNOSIS — R296 Repeated falls: Secondary | ICD-10-CM | POA: Diagnosis not present

## 2019-03-10 DIAGNOSIS — R339 Retention of urine, unspecified: Secondary | ICD-10-CM | POA: Diagnosis not present

## 2019-03-10 DIAGNOSIS — M6281 Muscle weakness (generalized): Secondary | ICD-10-CM | POA: Diagnosis not present

## 2019-03-10 DIAGNOSIS — Z466 Encounter for fitting and adjustment of urinary device: Secondary | ICD-10-CM | POA: Diagnosis not present

## 2019-03-10 DIAGNOSIS — F0391 Unspecified dementia with behavioral disturbance: Secondary | ICD-10-CM | POA: Diagnosis not present

## 2019-03-11 ENCOUNTER — Ambulatory Visit: Payer: Self-pay | Admitting: Family

## 2019-03-12 DIAGNOSIS — R296 Repeated falls: Secondary | ICD-10-CM | POA: Diagnosis not present

## 2019-03-12 DIAGNOSIS — R41841 Cognitive communication deficit: Secondary | ICD-10-CM | POA: Diagnosis not present

## 2019-03-12 DIAGNOSIS — Z466 Encounter for fitting and adjustment of urinary device: Secondary | ICD-10-CM | POA: Diagnosis not present

## 2019-03-12 DIAGNOSIS — R339 Retention of urine, unspecified: Secondary | ICD-10-CM | POA: Diagnosis not present

## 2019-03-12 DIAGNOSIS — M6281 Muscle weakness (generalized): Secondary | ICD-10-CM | POA: Diagnosis not present

## 2019-03-12 DIAGNOSIS — F0391 Unspecified dementia with behavioral disturbance: Secondary | ICD-10-CM | POA: Diagnosis not present

## 2019-03-13 ENCOUNTER — Telehealth: Payer: Self-pay

## 2019-03-13 DIAGNOSIS — R296 Repeated falls: Secondary | ICD-10-CM | POA: Diagnosis not present

## 2019-03-13 DIAGNOSIS — R41841 Cognitive communication deficit: Secondary | ICD-10-CM | POA: Diagnosis not present

## 2019-03-13 DIAGNOSIS — Z466 Encounter for fitting and adjustment of urinary device: Secondary | ICD-10-CM | POA: Diagnosis not present

## 2019-03-13 DIAGNOSIS — F0391 Unspecified dementia with behavioral disturbance: Secondary | ICD-10-CM | POA: Diagnosis not present

## 2019-03-13 DIAGNOSIS — R339 Retention of urine, unspecified: Secondary | ICD-10-CM | POA: Diagnosis not present

## 2019-03-13 DIAGNOSIS — M6281 Muscle weakness (generalized): Secondary | ICD-10-CM | POA: Diagnosis not present

## 2019-03-13 NOTE — Telephone Encounter (Signed)
Patients daughter [name] called to ask what the results from her father's urine culture was because the nurse took a sample from her father for testing I checked the chart and I don't see any lab orders in the patients chart for this and she said the therapist had seen her father the other day and suggested she ask to see if Janett Billow could possible write a script for him a wheel chair as he is not getting around on his own very well I called [facility] and spoke with there nurse and she said there had been a urine taken and she would fax the results over to our office

## 2019-03-14 ENCOUNTER — Encounter: Payer: Self-pay | Admitting: Nurse Practitioner

## 2019-03-14 ENCOUNTER — Other Ambulatory Visit: Payer: Self-pay

## 2019-03-14 ENCOUNTER — Ambulatory Visit (INDEPENDENT_AMBULATORY_CARE_PROVIDER_SITE_OTHER): Payer: Medicare Other | Admitting: Nurse Practitioner

## 2019-03-14 DIAGNOSIS — I824Y1 Acute embolism and thrombosis of unspecified deep veins of right proximal lower extremity: Secondary | ICD-10-CM

## 2019-03-14 DIAGNOSIS — R531 Weakness: Secondary | ICD-10-CM

## 2019-03-14 DIAGNOSIS — Z7189 Other specified counseling: Secondary | ICD-10-CM

## 2019-03-14 DIAGNOSIS — Z960 Presence of urogenital implants: Secondary | ICD-10-CM

## 2019-03-14 DIAGNOSIS — Z978 Presence of other specified devices: Secondary | ICD-10-CM

## 2019-03-14 NOTE — Telephone Encounter (Signed)
Please set up a telephone visit so we can discuss with family- they missed follow up appt with dinah

## 2019-03-14 NOTE — Telephone Encounter (Signed)
Spoke with patient's wife and advised results, She wanted the visit with Janett Billow. Also call the [facility] nurse to advise about the telephone visit

## 2019-03-14 NOTE — Telephone Encounter (Signed)
Nurse from [facility] calling stating pt got Bactrim on 03/06/19 for urinary problems and she states the family is concerned because pt is not any better. Please advise if Bactrim is susceptible to this.

## 2019-03-14 NOTE — Progress Notes (Signed)
This service is provided via telemedicine  No vital signs collected/recorded due to the encounter was a telemedicine visit.   Location of patient (ex: home, work):  Home  Patient consents to a telephone visit:  Yes  Location of the provider (ex: office, home):[facility], Office   Name of any referring provider: N/A  Names of all persons participating in the telemedicine service and their role in the encounter: S.Chrae B/CMA, Roger Mustache, NP, Roger Gardner (wife), Roger Gardner (daughter in law) and Patient   Time spent on call:  5 min with medical assistant      Careteam: Patient Care Team: [name], NP as PCP - General (Geriatric Medicine)  Advanced Directive information    Allergies  Allergen Reactions  . Penicillins Shortness Of Breath and Rash    Did it involve swelling of the face/tongue/throat, SOB, or low BP? Unknown Did it involve sudden or severe rash/hives, skin peeling, or any reaction on the inside of your mouth or nose? Unknown Did you need to seek medical attention at a hospital or doctor's office? Unknown When did it last happen? 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   . Codeine Other (See Comments)    Altered mental status    Chief Complaint  Patient presents with  . Follow-up    Per family request, discuss care     HPI: Patient is a 85 y.o. male for family request. Missed appt because his wife could not get to the car. Wanted to get clarification on the urine report. Has been on bactrim for almost a week.  catheter has been leaking and they have not been out this week.  They have been calling the home health agency and plan to change catheter next week. Pt tugs and mess with catheter frequently.  Urine continues to be orange/amber and has a bad smell.  Drinks 1 cup of coffee and  3- 12 oz glasses of 2% milk a day. Family is encouraging him to drink throughout the day.  His food intake is "good " per family. Planning on trying ensure.  No fevers or chills.  Wife wants him comfortable.  Advanced directives discussed with wife. They have DNR but not at the house   Review of Systems:  Review of Systems  Unable to perform ROS: Dementia    Past Medical History:  Diagnosis Date  . 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 Flu 1999   Hospitalized by Dr. Lenord Fellers IM   Past Surgical History:  Procedure Laterality Date  . HERNIA REPAIR    . KIDNEY STONE SURGERY     Basket extraction by Dr. Michela Pitcher    Social History:   reports that he quit smoking about 45 years ago. His smoking use included cigarettes. He has a 25.00 pack-year smoking history. He has never used smokeless tobacco. He reports previous alcohol use. He reports that he does not use drugs.  Family History  Problem Relation Age of Onset  . Pneumonia Mother   . Cancer Brother   . Brain cancer Brother   . Alzheimer's disease Sister   . Breast cancer Sister     Medications: Patient's Medications  New Prescriptions   No medications on file  Previous Medications   APIXABAN (ELIQUIS) 5 MG TABS TABLET    Take 5 mg by mouth  2 (two) times daily.   DIPHENHYDRAMINE (BENADRYL) 50 MG TABLET    Take 50 mg by mouth as needed for allergies.   LORATADINE (CLARITIN) 10 MG TABLET    Take 10 mg by mouth as needed for allergies.   SULFAMETHOXAZOLE-TRIMETHOPRIM (BACTRIM DS) 800-160 MG TABLET    Take 1 tablet by mouth 2 (two) times daily.   UNABLE TO FIND    Order: Urinalysis, culture and sensitivity. Fax results to [phone] Attention [name]  Modified Medications   No medications on file  Discontinued Medications   No medications on file    Physical Exam:  There were no vitals filed for this visit. There is no height or weight on file to calculate BMI. Wt Readings from Last 3 Encounters:  12/27/18 154 lb  (69.9 kg)  12/24/18 155 lb 12.8 oz (70.7 kg)  11/11/18 140 lb (63.5 kg)     Labs reviewed: Basic Metabolic Panel: Recent Labs    11/11/18 1714 12/24/18 1053 02/04/19 1016  NA 143 139 139  K 4.2 4.8 4.5  CL 109 103 104  CO2 27 31 28   GLUCOSE 122* 81 83  BUN 30* 23 22  CREATININE 1.29* 1.19* 1.22*  CALCIUM 8.6* 9.4 9.2   Liver Function Tests: Recent Labs    09/27/18 1447 11/11/18 1714  AST 19 22  ALT 12 23  ALKPHOS  --  64  BILITOT 1.1 0.5  PROT 7.2 6.9  ALBUMIN  --  3.1*   No results for input(s): LIPASE, AMYLASE in the last 8760 hours. No results for input(s): AMMONIA in the last 8760 hours. CBC: Recent Labs    12/24/18 1053 12/27/18 1159 02/04/19 1016  WBC 9.2 10.8 13.0*  NEUTROABS 5,097 6,275 8,047*  HGB 12.7* 12.8* 12.7*  HCT 40.0 39.7 39.7  MCV 87.7 86.7 86.5  PLT 255 267 258   Lipid Panel: No results for input(s): CHOL, HDL, LDLCALC, TRIG, CHOLHDL, LDLDIRECT in the last 8760 hours. TSH: No results for input(s): TSH in the last 8760 hours. A1C: No results found for: HGBA1C   Assessment/Plan 1. Generalized weakness -progressive decline, likely multifactoral due to decrease in oral intake and progressive dementia. Continues to have PT and family encouragement.   2. Chronic indwelling Foley catheter -will tug and manipulate catheter. UA revealed some blood which could be due to trama (he is also on eliquis due to DVT), culture revealed 25-50K colonies from entercoccus faecalis which is not enough to be considered a UTI and this was discussed with family. Proper catheter can was again discussed.  3. Acute deep vein thrombosis (DVT) of proximal vein of right lower extremity (HCC) -continues on eliquis twice daily   4. Advance care planning -discussed goals of care - DNR (Do Not Resuscitate)  [name]. [name], [place] [facility] [phone]   Virtual Visit via Telephone Note  I connected with pt and  daughter-in-law on 03/14/19 at  3:15 PM EDT by telephone and verified that I am speaking with the correct person using two identifiers.  Location: Patient: home Provider: office   I discussed the limitations, risks, security and privacy concerns of performing an evaluation and management service by telephone and the availability of in person appointments. I also discussed with the patient that there may be a patient responsible charge related to this service. The patient expressed understanding and agreed to proceed.   I discussed the assessment and treatment plan with the patient. The patient was provided an opportunity  to ask questions and all were answered. The patient agreed with the plan and demonstrated an understanding of the instructions.   The patient was advised to call back or seek an in-person evaluation if the symptoms worsen or if the condition fails to improve as anticipated.  I provided 21 minutes of non-face-to-face time during this encounter.  [name]. Harle Battiest Avs printed and mailed

## 2019-03-15 ENCOUNTER — Emergency Department (HOSPITAL_COMMUNITY): Payer: Medicare Other

## 2019-03-15 ENCOUNTER — Inpatient Hospital Stay (HOSPITAL_COMMUNITY): Payer: Medicare Other

## 2019-03-15 ENCOUNTER — Other Ambulatory Visit: Payer: Self-pay

## 2019-03-15 ENCOUNTER — Encounter (HOSPITAL_COMMUNITY): Payer: Self-pay | Admitting: *Deleted

## 2019-03-15 ENCOUNTER — Inpatient Hospital Stay (HOSPITAL_COMMUNITY)
Admission: EM | Admit: 2019-03-15 | Discharge: 2019-03-18 | DRG: 683 | Disposition: A | Discharge status: Home Health | Payer: Medicare Other | Attending: Family Medicine | Admitting: Family Medicine

## 2019-03-15 DIAGNOSIS — Z20828 Contact with and (suspected) exposure to other viral communicable diseases: Secondary | ICD-10-CM | POA: Diagnosis present

## 2019-03-15 DIAGNOSIS — Z87891 Personal history of nicotine dependence: Secondary | ICD-10-CM

## 2019-03-15 DIAGNOSIS — Z79899 Other long term (current) drug therapy: Secondary | ICD-10-CM

## 2019-03-15 DIAGNOSIS — Z466 Encounter for fitting and adjustment of urinary device: Secondary | ICD-10-CM | POA: Diagnosis not present

## 2019-03-15 DIAGNOSIS — N179 Acute kidney failure, unspecified: Secondary | ICD-10-CM | POA: Diagnosis present

## 2019-03-15 DIAGNOSIS — G459 Transient cerebral ischemic attack, unspecified: Secondary | ICD-10-CM | POA: Diagnosis not present

## 2019-03-15 DIAGNOSIS — I82401 Acute embolism and thrombosis of unspecified deep veins of right lower extremity: Secondary | ICD-10-CM | POA: Diagnosis present

## 2019-03-15 DIAGNOSIS — Z66 Do not resuscitate: Secondary | ICD-10-CM | POA: Diagnosis present

## 2019-03-15 DIAGNOSIS — Z7901 Long term (current) use of anticoagulants: Secondary | ICD-10-CM | POA: Diagnosis not present

## 2019-03-15 DIAGNOSIS — N200 Calculus of kidney: Secondary | ICD-10-CM

## 2019-03-15 DIAGNOSIS — F0390 Unspecified dementia without behavioral disturbance: Secondary | ICD-10-CM | POA: Diagnosis present

## 2019-03-15 DIAGNOSIS — I517 Cardiomegaly: Secondary | ICD-10-CM | POA: Diagnosis not present

## 2019-03-15 DIAGNOSIS — R1310 Dysphagia, unspecified: Secondary | ICD-10-CM | POA: Diagnosis present

## 2019-03-15 DIAGNOSIS — R41841 Cognitive communication deficit: Secondary | ICD-10-CM | POA: Diagnosis not present

## 2019-03-15 DIAGNOSIS — R319 Hematuria, unspecified: Secondary | ICD-10-CM | POA: Diagnosis present

## 2019-03-15 DIAGNOSIS — Z86718 Personal history of other venous thrombosis and embolism: Secondary | ICD-10-CM | POA: Diagnosis not present

## 2019-03-15 DIAGNOSIS — F0280 Dementia in other diseases classified elsewhere without behavioral disturbance: Secondary | ICD-10-CM | POA: Diagnosis not present

## 2019-03-15 DIAGNOSIS — R339 Retention of urine, unspecified: Secondary | ICD-10-CM | POA: Diagnosis not present

## 2019-03-15 DIAGNOSIS — E875 Hyperkalemia: Secondary | ICD-10-CM | POA: Diagnosis present

## 2019-03-15 DIAGNOSIS — N3091 Cystitis, unspecified with hematuria: Secondary | ICD-10-CM | POA: Diagnosis not present

## 2019-03-15 DIAGNOSIS — E860 Dehydration: Secondary | ICD-10-CM | POA: Diagnosis not present

## 2019-03-15 DIAGNOSIS — Z885 Allergy status to narcotic agent status: Secondary | ICD-10-CM | POA: Diagnosis not present

## 2019-03-15 DIAGNOSIS — R0689 Other abnormalities of breathing: Secondary | ICD-10-CM | POA: Diagnosis not present

## 2019-03-15 DIAGNOSIS — M6281 Muscle weakness (generalized): Secondary | ICD-10-CM | POA: Diagnosis not present

## 2019-03-15 DIAGNOSIS — I6782 Cerebral ischemia: Secondary | ICD-10-CM | POA: Diagnosis not present

## 2019-03-15 DIAGNOSIS — R296 Repeated falls: Secondary | ICD-10-CM | POA: Diagnosis not present

## 2019-03-15 DIAGNOSIS — Z880 Allergy status to penicillin: Secondary | ICD-10-CM

## 2019-03-15 DIAGNOSIS — Z87442 Personal history of urinary calculi: Secondary | ICD-10-CM

## 2019-03-15 DIAGNOSIS — F0391 Unspecified dementia with behavioral disturbance: Secondary | ICD-10-CM | POA: Diagnosis not present

## 2019-03-15 DIAGNOSIS — R0902 Hypoxemia: Secondary | ICD-10-CM | POA: Diagnosis not present

## 2019-03-15 DIAGNOSIS — B3741 Candidal cystitis and urethritis: Secondary | ICD-10-CM

## 2019-03-15 DIAGNOSIS — G309 Alzheimer's disease, unspecified: Secondary | ICD-10-CM | POA: Diagnosis not present

## 2019-03-15 DIAGNOSIS — R404 Transient alteration of awareness: Secondary | ICD-10-CM | POA: Diagnosis not present

## 2019-03-15 DIAGNOSIS — R000 Tachycardia, unspecified: Secondary | ICD-10-CM | POA: Diagnosis not present

## 2019-03-15 HISTORY — DX: Unspecified dementia, unspecified severity, without behavioral disturbance, psychotic disturbance, mood disturbance, and anxiety: F03.90

## 2019-03-15 LAB — SARS CORONAVIRUS 2 BY RT PCR (HOSPITAL ORDER, PERFORMED IN ~~LOC~~ HOSPITAL LAB): SARS Coronavirus 2: NEGATIVE

## 2019-03-15 LAB — COMPREHENSIVE METABOLIC PANEL
ALT: 21 U/L (ref 0–44)
AST: 30 U/L (ref 15–41)
Albumin: 3.1 g/dL — ABNORMAL LOW (ref 3.5–5.0)
Alkaline Phosphatase: 76 U/L (ref 38–126)
Anion gap: 13 (ref 5–15)
BUN: 84 mg/dL — ABNORMAL HIGH (ref 8–23)
CO2: 22 mmol/L (ref 22–32)
Calcium: 9.5 mg/dL (ref 8.9–10.3)
Chloride: 104 mmol/L (ref 98–111)
Creatinine, Ser: 5.25 mg/dL — ABNORMAL HIGH (ref 0.61–1.24)
GFR calc Af Amer: 10 mL/min — ABNORMAL LOW (ref >60)
GFR calc non Af Amer: 9 mL/min — ABNORMAL LOW (ref >60)
Glucose, Bld: 130 mg/dL — ABNORMAL HIGH (ref 70–99)
Potassium: 5.9 mmol/L — ABNORMAL HIGH (ref 3.5–5.1)
Sodium: 139 mmol/L (ref 135–145)
Total Bilirubin: 0.7 mg/dL (ref 0.3–1.2)
Total Protein: 7.4 g/dL (ref 6.5–8.1)

## 2019-03-15 LAB — CBC WITH DIFFERENTIAL/PLATELET
Abs Immature Granulocytes: 0.37 10*3/uL — ABNORMAL HIGH (ref 0.00–0.07)
Basophils Absolute: 0.1 10*3/uL (ref 0.0–0.1)
Basophils Relative: 0 %
Eosinophils Absolute: 0 10*3/uL (ref 0.0–0.5)
Eosinophils Relative: 0 %
HCT: 41.3 % (ref 39.0–52.0)
Hemoglobin: 12.8 g/dL — ABNORMAL LOW (ref 13.0–17.0)
Immature Granulocytes: 2 %
Lymphocytes Relative: 9 %
Lymphs Abs: 2.2 10*3/uL (ref 0.7–4.0)
MCH: 26.8 pg (ref 26.0–34.0)
MCHC: 31 g/dL (ref 30.0–36.0)
MCV: 86.6 fL (ref 80.0–100.0)
Monocytes Absolute: 1.1 10*3/uL — ABNORMAL HIGH (ref 0.1–1.0)
Monocytes Relative: 4 %
Neutro Abs: 21.5 10*3/uL — ABNORMAL HIGH (ref 1.7–7.7)
Neutrophils Relative %: 85 %
Platelets: 443 10*3/uL — ABNORMAL HIGH (ref 150–400)
RBC: 4.77 MIL/uL (ref 4.22–5.81)
RDW: 13.5 % (ref 11.5–15.5)
WBC: 25.2 10*3/uL — ABNORMAL HIGH (ref 4.0–10.5)
nRBC: 0 % (ref 0.0–0.2)

## 2019-03-15 LAB — URINALYSIS, ROUTINE W REFLEX MICROSCOPIC
Bilirubin Urine: NEGATIVE
Glucose, UA: NEGATIVE mg/dL
Ketones, ur: NEGATIVE mg/dL
Nitrite: NEGATIVE
Protein, ur: >=300 mg/dL — AB
RBC / HPF: >50 RBC/hpf — ABNORMAL HIGH (ref 0–5)
Specific Gravity, Urine: 1.018 (ref 1.005–1.030)
WBC, UA: >50 WBC/hpf — ABNORMAL HIGH (ref 0–5)
pH: 9 — ABNORMAL HIGH (ref 5.0–8.0)

## 2019-03-15 LAB — PROTIME-INR
INR: 1.5 — ABNORMAL HIGH (ref 0.8–1.2)
Prothrombin Time: 18.3 seconds — ABNORMAL HIGH (ref 11.4–15.2)

## 2019-03-15 LAB — LACTIC ACID, PLASMA: Lactic Acid, Venous: 1.5 mmol/L (ref 0.5–1.9)

## 2019-03-15 MED ORDER — ACETAMINOPHEN 650 MG RE SUPP: 650.0000 mg | Freq: Four times a day (QID) | RECTAL | Status: DC | PRN

## 2019-03-15 MED ORDER — SODIUM CHLORIDE 0.9 % IV BOLUS
1000.0000 mL | Freq: Once | INTRAVENOUS | Status: AC
  Administered 2019-03-15: 1000 mL via INTRAVENOUS

## 2019-03-15 MED ORDER — ONDANSETRON HCL 4 MG/2ML IJ SOLN: 4.0000 mg | Freq: Four times a day (QID) | INTRAMUSCULAR | Status: DC | PRN

## 2019-03-15 MED ORDER — FLUCONAZOLE 100MG IVPB
100.0000 mg | INTRAVENOUS | Status: DC
  Administered 2019-03-16 (×2): 100 mg via INTRAVENOUS
  Filled 2019-03-15 (×3): qty 50

## 2019-03-15 MED ORDER — SODIUM POLYSTYRENE SULFONATE 15 GM/60ML PO SUSP
30.0000 g | Freq: Once | ORAL | Status: DC
  Filled 2019-03-15: qty 120

## 2019-03-15 MED ORDER — ACETAMINOPHEN 325 MG PO TABS: 650.0000 mg | ORAL_TABLET | Freq: Four times a day (QID) | ORAL | Status: DC | PRN

## 2019-03-15 MED ORDER — ONDANSETRON HCL 4 MG PO TABS: 4.0000 mg | ORAL_TABLET | Freq: Four times a day (QID) | ORAL | Status: DC | PRN

## 2019-03-15 MED ORDER — SODIUM CHLORIDE 0.9 % IV SOLN
INTRAVENOUS | Status: DC
  Administered 2019-03-16 (×2): via INTRAVENOUS

## 2019-03-15 MED ORDER — SODIUM POLYSTYRENE SULFONATE 15 GM/60ML PO SUSP
30.0000 g | Freq: Once | ORAL | Status: AC
  Administered 2019-03-15: 30 g via RECTAL

## 2019-03-15 NOTE — ED Triage Notes (Signed)
Patient currently on antibiotics for UTI, patient also on blood thinners for DVT.  Home health nurse changed foley catheter and noted frank blood into tubing.  Patient has history of dementia and is non verbal.

## 2019-03-15 NOTE — ED Notes (Signed)
Lab in room.

## 2019-03-15 NOTE — ED Provider Notes (Signed)
Medical screening examination/treatment/procedure(s) were conducted as a shared visit with non-physician practitioner(s) and myself.  I personally evaluated the patient during the encounter.  EKG Interpretation  Date/Time:  Saturday March 15 2019 18:16:45 EDT Ventricular Rate:  105 PR Interval:    QRS Duration: 81 QT Interval:  324 QTC Calculation: 429 R Axis:   -75 Text Interpretation:  Sinus tachycardia Inferior infarct, old Anterior infarct, old Confirmed by Fredia Sorrow [phone]) on 03/15/2019 6:33:41 PM   Patient seen by me along with physician assistant.  Patient's had indwelling Foley catheter for several months now followed by urology.  Patient is also on blood thinners for deep vein thrombosis.  Home health nurse changed Foley catheter and noted frank blood in the tubing.  Patient also has a history of dementia and is not verbal.  Work-up with significant findings with a marked leukocytosis 25,000.  Urinalysis course grossly bloody.  Always could be secondary to infection.  Culture done.  Did show some yeast.  Discussed with hospitalist.  Will start him on Levaquin.  Renal function showed acute kidney injury.  And the Levaquin will need to be adjusted so pharmacy consult was put in for that.  Patient will require admission.  Chest x-ray without acute findings.  No evidence of pneumonia.  COVID testing pending.  We also did blood cultures.  And lactic acid was ordered.  Lactic acid was not significantly elevated.   Fredia Sorrow, MD 03/15/19 2138

## 2019-03-15 NOTE — ED Notes (Signed)
Pt to CT

## 2019-03-15 NOTE — ED Notes (Signed)
Pt unable to take po  meds at this time. edp notified

## 2019-03-15 NOTE — H&P (Signed)
TRH H&P    Patient Demographics:    Roger Gardner, is a 85 y.o. male  MRN: [id number]  DOB - 05-26-1922  Admit Date - 03/15/2019  Referring MD/NP/PA: Aundria Mems  Outpatient Primary MD for the patient is [name], NP  Patient coming from: Home  Chief complaint-hematuria   HPI:    Roger Gardner  is a 85 y.o. male, with history of dementia who lives at home with his wife and children's caregivers also has home health nurse assistance.  Patient has chronic Foley catheter placed due to urinary retention for past 3 months.  He usually gets Foley catheter replaced by home health agency.  For past few days patient was lethargic, urine culture was obtained and patient started on Bactrim.  But as per family patient started having hematuria 3 days ago and also was more lethargic.  Today the Foley catheter was replaced by the home health RN and advised to go to the ED for further evaluation.  Patient is on Eliquis for DVT in right lower extremity which was recently diagnosed. Patient has plain dementia so history obtained from patient's daughter at bedside. No history of nausea vomiting or diarrhea No history of abdominal pain No chest pain Patient also has been having difficulty swallowing due to lethargy as per daughter. No previous history of stroke or seizures.    Review of systems:    In addition to the HPI above,    All other systems reviewed and are negative.    Past History of the following :    Past Medical History:  Diagnosis Date  . 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 Flu 1999   Hospitalized by Dr. Lenord Fellers IM      Past Surgical History:  Procedure Laterality Date  . HERNIA REPAIR    . KIDNEY STONE SURGERY     Basket extraction by Dr. Michela Pitcher       Social History:      Social History   Tobacco Use  . Smoking status: Former Smoker    Packs/day: 1.00    Years: 25.00     Pack years: 25.00    Types: Cigarettes    Quit date: 1975    Years since quitting: 45.6  . Smokeless tobacco: Never Used  Substance Use Topics  . Alcohol use: Not Currently       Family History :     Family History  Problem Relation Age of [place]  . Pneumonia Mother   . Cancer Brother   . Brain cancer Brother   . Alzheimer's disease Sister   . Breast cancer Sister       Home Medications:   Prior to Admission medications   Medication Sig Start Date End Date Taking? Authorizing Provider  apixaban (ELIQUIS) 5 MG TABS tablet Take 5 mg by mouth 2 (two) times daily.   Yes [provider]  diphenhydrAMINE (BENADRYL) 50 MG tablet Take 50 mg by mouth as needed for allergies.    [provider]  loratadine (CLARITIN)  10 MG tablet Take 10 mg by mouth as needed for allergies.    [provider]  sulfamethoxazole-trimethoprim (BACTRIM DS) 800-160 MG tablet Take 1 tablet by mouth 2 (two) times daily. 03/06/19   [name], NP  UNABLE TO FIND Order: Urinalysis, culture and sensitivity. Fax results to [phone] Attention Janett Billow Patient not taking: Reported on 03/14/2019 12/30/18   [name], NP     Allergies:     Allergies  Allergen Reactions  . Penicillins Shortness Of Breath and Rash    Did it involve swelling of the face/tongue/throat, SOB, or low BP? Unknown Did it involve sudden or severe rash/hives, skin peeling, or any reaction on the inside of your mouth or nose? Unknown Did you need to seek medical attention at a hospital or doctor's office? Unknown When did it last happen? 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   . Codeine Other (See Comments)    Altered mental status     Physical Exam:   Vitals  Blood pressure 127/61, pulse (!) 105, temperature 98.1 F (36.7 C),  temperature source Oral, resp. rate (!) 25, height 5\' 9"  (1.753 m), weight 74.8 kg, SpO2 98 %.  1.  General: Appears in no acute distress  2. Psychiatric: Alert, not oriented x3  3. Neurologic: Cranial nerves II through XII grossly intact, moving all extremities  4. HEENMT:  Atraumatic normocephalic  5. Respiratory : Clear to auscultation bilaterally, no wheezing or crackles  6. Cardiovascular : S1-S2, regular, no murmur auscultated  7. Gastrointestinal:  Abdomen is soft, nontender, no organomegaly      Data Review:    CBC Recent Labs  Lab 03/15/19 1647  WBC 25.2*  HGB 12.8*  HCT 41.3  PLT 443*  MCV 86.6  MCH 26.8  MCHC 31.0  RDW 13.5  LYMPHSABS 2.2  MONOABS 1.1*  EOSABS 0.0  BASOSABS 0.1   ------------------------------------------------------------------------------------------------------------------  Results for orders placed or performed during the hospital encounter of 03/15/19 (from the past 48 hour(s))  CBC with Differential     Status: Abnormal   Collection Time: 03/15/19  4:47 PM  Result Value Ref Range   WBC 25.2 (H) 4.0 - 10.5 K/uL   RBC 4.77 4.22 - 5.81 MIL/uL   Hemoglobin 12.8 (L) 13.0 - 17.0 g/dL   HCT 41.3 39.0 - 52.0 %   MCV 86.6 80.0 - 100.0 fL   MCH 26.8 26.0 - 34.0 pg   MCHC 31.0 30.0 - 36.0 g/dL   RDW 13.5 11.5 - 15.5 %   Platelets 443 (H) 150 - 400 K/uL   nRBC 0.0 0.0 - 0.2 %   Neutrophils Relative % 85 %   Neutro Abs 21.5 (H) 1.7 - 7.7 K/uL   Lymphocytes Relative 9 %   Lymphs Abs 2.2 0.7 - 4.0 K/uL   Monocytes Relative 4 %   Monocytes Absolute 1.1 (H) 0.1 - 1.0 K/uL   Eosinophils Relative 0 %   Eosinophils Absolute 0.0 0.0 - 0.5 K/uL   Basophils Relative 0 %   Basophils Absolute 0.1 0.0 - 0.1 K/uL   Immature Granulocytes 2 %   Abs Immature Granulocytes 0.37 (H) 0.00 - 0.07 K/uL    Comment: Performed at [facility], [address] [place],  [address]  Comprehensive metabolic panel     Status: Abnormal    Collection Time: 03/15/19  4:47 PM  Result Value Ref Range   Sodium 139 135 - 145 mmol/L   Potassium 5.9 (H) 3.5 - 5.1 mmol/L   Chloride 104 98 - 111 mmol/L   CO2 22 22 - 32 mmol/L   Glucose, Bld 130 (H) 70 - 99 mg/dL   BUN 84 (H) 8 - 23 mg/dL   Creatinine, Ser 5.25 (H) 0.61 - 1.24 mg/dL   Calcium 9.5 8.9 - 10.3 mg/dL   Total Protein 7.4 6.5 - 8.1 g/dL   Albumin 3.1 (L) 3.5 - 5.0 g/dL   AST 30 15 - 41 U/L   ALT 21 0 - 44 U/L   Alkaline Phosphatase 76 38 - 126 U/L   Total Bilirubin 0.7 0.3 - 1.2 mg/dL   GFR calc non Af Amer 9 (L) >60 mL/min   GFR calc Af Amer 10 (L) >60 mL/min   Anion gap 13 5 - 15    Comment: Performed at [facility], [address], [place], [place] [address]  Urinalysis, Routine w reflex microscopic     Status: Abnormal   Collection Time: 03/15/19  6:20 PM  Result Value Ref Range   Color, Urine RED (A) YELLOW    Comment: BIOCHEMICALS MAY BE AFFECTED BY COLOR   APPearance CLOUDY (A) CLEAR   Specific Gravity, Urine 1.018 1.005 - 1.030   pH 9.0 (H) 5.0 - 8.0   Glucose, UA NEGATIVE NEGATIVE mg/dL   Hgb urine dipstick LARGE (A) NEGATIVE   Bilirubin Urine NEGATIVE NEGATIVE   Ketones, ur NEGATIVE NEGATIVE mg/dL   Protein, ur >=300 (A) NEGATIVE mg/dL   Nitrite NEGATIVE NEGATIVE   Leukocytes,Ua MODERATE (A) NEGATIVE   RBC / HPF >50 (H) 0 - 5 RBC/hpf   WBC, UA >50 (H) 0 - 5 WBC/hpf   Bacteria, UA RARE (A) NONE SEEN   WBC Clumps PRESENT    Budding Yeast PRESENT    Triple Phosphate Crystal PRESENT     Comment: Performed at [facility], [address], [place], [place] [address]  Blood culture (routine x 2)     Status: None (Preliminary result)   Collection Time: 03/15/19  7:25 PM   Specimen: BLOOD RIGHT WRIST  Result Value Ref Range   Specimen Description BLOOD RIGHT WRIST    Special Requests      BOTTLES DRAWN AEROBIC AND ANAEROBIC Blood Culture adequate volume Performed at [facility], [address], [place], [place] [address]    Culture PENDING    Report  Status PENDING   Lactic acid, plasma     Status: None   Collection Time: 03/15/19  7:41 PM  Result Value Ref Range   Lactic Acid, Venous 1.5 0.5 - 1.9 mmol/L    Comment: Performed at [facility], [address], [place], [place] [address]  Blood culture (routine x 2)     Status: None (Preliminary result)   Collection Time: 03/15/19  7:41 PM   Specimen: BLOOD LEFT ARM  Result Value Ref Range   Specimen Description BLOOD LEFT ARM    Special Requests      BOTTLES DRAWN AEROBIC ONLY Blood Culture adequate volume Performed at [facility], [address], [place], [place] [address]    Culture PENDING    Report Status PENDING   SARS Coronavirus 2 [facility] order, Performed in [facility] hospital lab) Nasopharyngeal Nasopharyngeal Swab     Status: None   Collection Time: 03/15/19  8:30 PM   Specimen: Nasopharyngeal Swab  Result Value Ref Range  SARS Coronavirus 2 NEGATIVE NEGATIVE    Comment: (NOTE) If result is NEGATIVE SARS-CoV-2 target nucleic acids are NOT DETECTED. The SARS-CoV-2 RNA is generally detectable in upper and lower  respiratory specimens during the acute phase of infection. The lowest  concentration of SARS-CoV-2 viral copies this assay can detect is 250  copies / mL. A negative result does not preclude SARS-CoV-2 infection  and should not be used as the sole basis for treatment or other  patient management decisions.  A negative result may occur with  improper specimen collection / handling, submission of specimen other  than nasopharyngeal swab, presence of viral mutation(s) within the  areas targeted by this assay, and inadequate number of viral copies  (<250 copies / mL). A negative result must be combined with clinical  observations, patient history, and epidemiological information. If result is POSITIVE SARS-CoV-2 target nucleic acids are DETECTED. The SARS-CoV-2 RNA is generally detectable in upper and lower  respiratory specimens dur ing the acute phase of  infection.  Positive  results are indicative of active infection with SARS-CoV-2.  Clinical  correlation with patient history and other diagnostic information is  necessary to determine patient infection status.  Positive results do  not rule out bacterial infection or co-infection with other viruses. If result is PRESUMPTIVE POSTIVE SARS-CoV-2 nucleic acids MAY BE PRESENT.   A presumptive positive result was obtained on the submitted specimen  and confirmed on repeat testing.  While 2019 novel coronavirus  (SARS-CoV-2) nucleic acids may be present in the submitted sample  additional confirmatory testing may be necessary for epidemiological  and / or clinical management purposes  to differentiate between  SARS-CoV-2 and other Sarbecovirus currently known to infect humans.  If clinically indicated additional testing with an alternate test  methodology [phone]) is advised. The SARS-CoV-2 RNA is generally  detectable in upper and lower respiratory sp ecimens during the acute  phase of infection. The expected result is Negative. Fact Sheet for Patients:  StrictlyIdeas.no Fact Sheet for Healthcare Providers: BankingDealers.co.za This test is not yet approved or cleared by the [place] FDA and has been authorized for detection and/or diagnosis of SARS-CoV-2 by FDA under an Emergency Use Authorization (EUA).  This EUA will remain in effect (meaning this test can be used) for the duration of the COVID-19 declaration under Section 564(b)(1) of the Act, 21 U.S.C. section 360bbb-3(b)(1), unless the authorization is terminated or revoked sooner. Performed at [facility], [address] [place], [place] [address]     Chemistries  Recent Labs  Lab 03/15/19 1647  NA 139  K 5.9*  CL 104  CO2 22  GLUCOSE 130*  BUN 84*  CREATININE 5.25*  CALCIUM 9.5  AST 30  ALT 21  ALKPHOS 76  BILITOT 0.7    ------------------------------------------------------------------------------------------------------------------  ------------------------------------------------------------------------------------------------------------------ GFR: Estimated Creatinine Clearance: 8.4 mL/min (A) (by C-G formula based on SCr of 5.25 mg/dL (H)). Liver Function Tests: Recent Labs  Lab 03/15/19 1647  AST 30  ALT 21  ALKPHOS 76  BILITOT 0.7  PROT 7.4  ALBUMIN 3.1*    --------------------------------------------------------------------------------------------------------------- Urine analysis:    Component Value Date/Time   COLORURINE RED (A) 03/15/2019 1820   APPEARANCEUR CLOUDY (A) 03/15/2019 1820   LABSPEC 1.018 03/15/2019 1820   PHURINE 9.0 (H) 03/15/2019 1820   GLUCOSEU NEGATIVE 03/15/2019 1820   HGBUR LARGE (A) 03/15/2019 1820   BILIRUBINUR NEGATIVE 03/15/2019 1820   BILIRUBINUR Negative 12/27/2018 1200   KETONESUR NEGATIVE 03/15/2019 1820   PROTEINUR >=300 (A)  03/15/2019 1820   UROBILINOGEN 0.2 12/27/2018 1200   NITRITE NEGATIVE 03/15/2019 1820   LEUKOCYTESUR MODERATE (A) 03/15/2019 1820      Imaging Results:    Ct Head Wo Contrast  Result Date: 03/15/2019 CLINICAL DATA:  85 year old with dementia. Focal neuro deficit, > 6 hrs, stroke suspected EXAM: CT HEAD WITHOUT CONTRAST TECHNIQUE: Contiguous axial images were obtained from the base of the skull through the vertex without intravenous contrast. COMPARISON:  Remote head CT 12/27/2009 FINDINGS: Brain: No intracranial hemorrhage, mass effect, or midline shift. Generalized atrophy, progressed from 2011. Remote lacunar infarct in the left basal ganglia and periventricular white matter. Advanced chronic small vessel ischemia. No hydrocephalus. The basilar cisterns are patent. No evidence of territorial infarct or acute ischemia. No extra-axial or intracranial fluid collection. Vascular: Atherosclerosis of skullbase vasculature without  hyperdense vessel or abnormal calcification. Skull: No fracture or focal lesion. Sinuses/Orbits: Mild mucosal thickening of right maxillary sinus with tiny mucous retention cysts. No acute findings. Bilateral cataract resection. Other: None. IMPRESSION: 1. No acute intracranial abnormality. 2. Generalized atrophy and advanced chronic small vessel ischemia. Remote lacunar infarcts in the left basal ganglia and periventricular white matter. Electronically Signed   By: Keith Rake M.D.   On: 03/15/2019 20:39   Dg Chest Portable 1 View  Result Date: 03/15/2019 CLINICAL DATA:  Altered mental status.  History of asbestosis EXAM: PORTABLE CHEST 1 VIEW COMPARISON:  06/16/2016 FINDINGS: Mild cardiomegaly. No confluent opacities, effusions or edema. No acute bony abnormality. IMPRESSION: Mild cardiomegaly.  No active disease. Electronically Signed   By: Rolm Baptise M.D.   On: 03/15/2019 18:18    My personal review of EKG: Rhythm NSR, no ST changes   Assessment & Plan:    Active Problems:   Hematuria   1. Hematuria-patient has history of kidney stones in the past, hematuria is clearing up after replacing Foley catheter.  Will obtain CT stone protocol.  Hold Eliquis.  Hemoglobin is stable.  Follow CBC in a.m.  2. UTI-patient was recently diagnosed with UTI, started on Bactrim.  He again has abnormal UA with slight budding yeast.  Will start Diflucan 100 mg IV every 24 hours, also start Levaquin per pharmacy consultation.  Urine culture has been obtained.  Follow urine culture results.  3. History of DVT right lower extremity-started on Eliquis recently.  Will hold Eliquis for now.  Check PT/INR.  If hematuria clears up, we will consider restarting Eliquis in a.m.    DVT Prophylaxis-   SCDs  AM Labs Ordered, also please review Full Orders  Family Communication: Admission, patients condition and plan of care including tests being ordered have been discussed with the patient who indicate  understanding and agree with the plan and Code Status.  Code Status: DNR  Admission status: Inpatient: Based on patients clinical presentation and evaluation of above clinical data, I have made determination that patient meets Inpatient criteria at this time.  Time spent in minutes : 60 minutes   Oswald Hillock M.D on 03/15/2019 at 10:09 PM

## 2019-03-15 NOTE — ED Provider Notes (Signed)
[facility] EMERGENCY DEPARTMENT Provider Note   CSN: [id number] Arrival date & time: 03/15/19  1620     History   Chief Complaint Chief Complaint  Patient presents with   Hematuria    HPI Roger Gardner is a 85 y.o. male who lives at home with wife and children as care givers, also has home health nurse assistance, history of asbestiosis, dementia and chronic foley use due to urinary retention presenting with hematuria in his foley bag which is persistent despite a course of bactrim which he completed several days ago for uti.  Health nurse arrival today and changed out foley bag, but blood in urine became even more pronounced.  Additionally, dg has noticed the patient has been leaning to the right with ambulation and has been dragging his right leg when attempting to use his walker - this was first noticed 2 days ago, when seen by home health nursing today, prompted here over concern for possible stroke.  He was diagnosed with dvt in his right lower leg several weeks ago and has been started on Eliquis for treatment of this condition.  Dg states he has been sleeping a lot more today, less active, but was able to eat breakfast and a small lunch.       The history is provided by a relative (daughter at bedside). The history is limited by the condition of the patient.    Past Medical History:  Diagnosis Date   Asbestosis ([place]) 2001   Mild case   Flu 1999   Hospitalized by Dr. Manuella Ghazi - [facility]    There are no active problems to display for this patient.   Past Surgical History:  Procedure Laterality Date   HERNIA REPAIR     KIDNEY STONE SURGERY     Basket extraction by Dr. Michela Pitcher         Home Medications    Prior to Admission medications   Medication Sig Start Date End Date Taking? Authorizing Provider  apixaban (ELIQUIS) 5 MG TABS tablet Take 5 mg by mouth 2 (two) times daily.   Yes [provider]  diphenhydrAMINE (BENADRYL) 50 MG tablet Take 50 mg by  mouth as needed for allergies.    [provider]  loratadine (CLARITIN) 10 MG tablet Take 10 mg by mouth as needed for allergies.    [provider]  sulfamethoxazole-trimethoprim (BACTRIM DS) 800-160 MG tablet Take 1 tablet by mouth 2 (two) times daily. 03/06/19   [name], NP  UNABLE TO FIND Order: Urinalysis, culture and sensitivity. Fax results to [phone] Attention Janett Billow Patient not taking: Reported on 03/14/2019 12/30/18   [name], NP    Family History Family History  Problem Relation Age of Onset   Pneumonia Mother    Cancer Brother    Brain cancer Brother    Alzheimer's disease Sister    Breast cancer Sister     Social History Social History   Tobacco Use   Smoking status: Former Smoker    Packs/day: 1.00    Years: 25.00    Pack years: 25.00    Types: Cigarettes    Quit date: 1975    Years since quitting: 45.6   Smokeless tobacco: Never Used  Substance Use Topics   Alcohol use: Not Currently   Drug use: Never     Allergies   Penicillins and Codeine   Review of Systems Review of Systems  Unable to perform ROS: Dementia  Genitourinary: Positive for hematuria.  Neurological: Positive for weakness.     Physical Exam Updated Vital Signs BP 127/61    Pulse (!) 105    Temp 98.1 F (36.7 C) (Oral)    Resp (!) 25    Ht 5\' 9"  (1.753 m)    Wt 74.8 kg    SpO2 98%    BMI 24.37 kg/m   Physical Exam Vitals signs and nursing note reviewed.  Constitutional:      General: He is sleeping. He is not in acute distress.    Appearance: He is well-developed and well-groomed.  HENT:     Head: Normocephalic and atraumatic.  Eyes:     Conjunctiva/sclera: Conjunctivae normal.     Pupils: Pupils are equal, round, and reactive to light.  Neck:     Musculoskeletal: Normal range of motion.  Cardiovascular:     Rate and Rhythm: Normal rate and regular rhythm.     Heart sounds: Normal heart sounds.  Pulmonary:     Effort:  Pulmonary effort is normal.     Breath sounds: Normal breath sounds. No wheezing.  Abdominal:     General: Abdomen is flat. Bowel sounds are normal.     Palpations: Abdomen is soft. There is no mass.     Tenderness: There is no abdominal tenderness.  Genitourinary:    Comments: Dark red blood in foley bag, lighter pink urine in tubing. Musculoskeletal: Normal range of motion.  Skin:    General: Skin is warm and dry.  Neurological:     Mental Status: He is easily aroused. He is confused.     Comments: Unable to access cranial nerves secondary to dementia. He moves all extremities without neglect.  Will grip fingers bilaterally.  Down going toes.        ED Treatments / Results  Labs (all labs ordered are listed, but only abnormal results are displayed) Labs Reviewed  CBC WITH DIFFERENTIAL/PLATELET - Abnormal; Notable for the following components:      Result Value   WBC 25.2 (*)    Hemoglobin 12.8 (*)    Platelets 443 (*)    Neutro Abs 21.5 (*)    Monocytes Absolute 1.1 (*)    Abs Immature Granulocytes 0.37 (*)    All other components within normal limits  COMPREHENSIVE METABOLIC PANEL - Abnormal; Notable for the following components:   Potassium 5.9 (*)    Glucose, Bld 130 (*)    BUN 84 (*)    Creatinine, Ser 5.25 (*)    Albumin 3.1 (*)    GFR calc non Af Amer 9 (*)    GFR calc Af Amer 10 (*)    All other components within normal limits  URINALYSIS, ROUTINE W REFLEX MICROSCOPIC - Abnormal; Notable for the following components:   Color, Urine RED (*)    APPearance CLOUDY (*)    pH 9.0 (*)    Hgb urine dipstick LARGE (*)    Protein, ur >=300 (*)    Leukocytes,Ua MODERATE (*)    RBC / HPF >50 (*)    WBC, UA >50 (*)    Bacteria, UA RARE (*)    All other components within normal limits  CULTURE, BLOOD (ROUTINE X 2)  CULTURE, BLOOD (ROUTINE X 2)  URINE CULTURE  SARS CORONAVIRUS 2 (HOSPITAL ORDER, [facility])  LACTIC ACID, PLASMA     EKG EKG Interpretation  Date/Time:  Saturday March 15 2019 18:16:45 EDT Ventricular Rate:  105 PR Interval:    QRS  Duration: 81 QT Interval:  324 QTC Calculation: 429 R Axis:   -75 Text Interpretation:  Sinus tachycardia Inferior infarct, old Anterior infarct, old Confirmed by Fredia Sorrow [phone]) on 03/15/2019 6:33:41 PM   Radiology Ct Head Wo Contrast  Result Date: 03/15/2019 CLINICAL DATA:  85 year old with dementia. Focal neuro deficit, > 6 hrs, stroke suspected EXAM: CT HEAD WITHOUT CONTRAST TECHNIQUE: Contiguous axial images were obtained from the base of the skull through the vertex without intravenous contrast. COMPARISON:  Remote head CT 12/27/2009 FINDINGS: Brain: No intracranial hemorrhage, mass effect, or midline shift. Generalized atrophy, progressed from 2011. Remote lacunar infarct in the left basal ganglia and periventricular white matter. Advanced chronic small vessel ischemia. No hydrocephalus. The basilar cisterns are patent. No evidence of territorial infarct or acute ischemia. No extra-axial or intracranial fluid collection. Vascular: Atherosclerosis of skullbase vasculature without hyperdense vessel or abnormal calcification. Skull: No fracture or focal lesion. Sinuses/Orbits: Mild mucosal thickening of right maxillary sinus with tiny mucous retention cysts. No acute findings. Bilateral cataract resection. Other: None. IMPRESSION: 1. No acute intracranial abnormality. 2. Generalized atrophy and advanced chronic small vessel ischemia. Remote lacunar infarcts in the left basal ganglia and periventricular white matter. Electronically Signed   By: Keith Rake M.D.   On: 03/15/2019 20:39   Dg Chest Portable 1 View  Result Date: 03/15/2019 CLINICAL DATA:  Altered mental status.  History of asbestosis EXAM: PORTABLE CHEST 1 VIEW COMPARISON:  06/16/2016 FINDINGS: Mild cardiomegaly. No confluent opacities, effusions or edema. No acute bony abnormality. IMPRESSION: Mild  cardiomegaly.  No active disease. Electronically Signed   By: Rolm Baptise M.D.   On: 03/15/2019 18:18    Procedures Procedures (including critical care time)  Medications Ordered in ED Medications  sodium polystyrene (KAYEXALATE) 15 GM/60ML suspension 30 g (has no administration in time range)  sodium chloride 0.9 % bolus 1,000 mL (1,000 mLs Intravenous New Bag/Given 03/15/19 1933)     Initial Impression / Assessment and Plan / ED Course  I have reviewed the triage vital signs and the nursing notes.  Pertinent labs & imaging results that were available during my care of the patient were reviewed by me and considered in my medical decision making (see chart for details).       Patient's labs were reviewed and interpreted. Imaging also reviewed - no CT findings suggesting subacute cva.    Patient with acute renal failure and dehydration with an elevation of potassium of 5.9.  He was given Kayexalate per rectum as he was unable to tolerate p.o. Kayexalate.  He was given IV fluids.  No EKG changes corresponding with hyperkalemia.  He has gross hematuria and an indwelling chronic Foley catheter.  Urine sample obtained from catheter tubing (new today, replaced by Home health prior to arrival here).   Profoundly elevated WBC count with a yeast cystitis, urine cx sent.  Lactate normal.   Discussed with Dr. Darrick Meigs who will admit pt.  Requested urology input, consult ordered.  levaquin started per pharmacy consult.    Final Clinical Impressions(s) / ED Diagnoses   Final diagnoses:  Acute renal failure, unspecified acute renal failure type [facility])  Dehydration  Hemorrhagic cystitis  Yeast cystitis    ED Discharge Orders    None       Landis Martins 03/15/19 2201    Fredia Sorrow, MD 03/22/19 1221

## 2019-03-16 ENCOUNTER — Encounter (HOSPITAL_COMMUNITY): Payer: Self-pay | Admitting: *Deleted

## 2019-03-16 ENCOUNTER — Inpatient Hospital Stay (HOSPITAL_COMMUNITY): Payer: Medicare Other

## 2019-03-16 DIAGNOSIS — N200 Calculus of kidney: Secondary | ICD-10-CM

## 2019-03-16 DIAGNOSIS — N179 Acute kidney failure, unspecified: Secondary | ICD-10-CM

## 2019-03-16 DIAGNOSIS — E860 Dehydration: Secondary | ICD-10-CM

## 2019-03-16 DIAGNOSIS — F0390 Unspecified dementia without behavioral disturbance: Secondary | ICD-10-CM | POA: Diagnosis present

## 2019-03-16 DIAGNOSIS — E875 Hyperkalemia: Secondary | ICD-10-CM | POA: Diagnosis present

## 2019-03-16 DIAGNOSIS — B3741 Candidal cystitis and urethritis: Secondary | ICD-10-CM

## 2019-03-16 DIAGNOSIS — Z7901 Long term (current) use of anticoagulants: Secondary | ICD-10-CM

## 2019-03-16 LAB — COMPREHENSIVE METABOLIC PANEL
ALT: 19 U/L (ref 0–44)
AST: 24 U/L (ref 15–41)
Albumin: 2.6 g/dL — ABNORMAL LOW (ref 3.5–5.0)
Alkaline Phosphatase: 65 U/L (ref 38–126)
Anion gap: 9 (ref 5–15)
BUN: 74 mg/dL — ABNORMAL HIGH (ref 8–23)
CO2: 24 mmol/L (ref 22–32)
Calcium: 8.9 mg/dL (ref 8.9–10.3)
Chloride: 112 mmol/L — ABNORMAL HIGH (ref 98–111)
Creatinine, Ser: 3.57 mg/dL — ABNORMAL HIGH (ref 0.61–1.24)
GFR calc Af Amer: 16 mL/min — ABNORMAL LOW (ref >60)
GFR calc non Af Amer: 14 mL/min — ABNORMAL LOW (ref >60)
Glucose, Bld: 86 mg/dL (ref 70–99)
Potassium: 5 mmol/L (ref 3.5–5.1)
Sodium: 145 mmol/L (ref 135–145)
Total Bilirubin: 0.6 mg/dL (ref 0.3–1.2)
Total Protein: 6.6 g/dL (ref 6.5–8.1)

## 2019-03-16 LAB — CBC
HCT: 36.5 % — ABNORMAL LOW (ref 39.0–52.0)
Hemoglobin: 11.1 g/dL — ABNORMAL LOW (ref 13.0–17.0)
MCH: 27 pg (ref 26.0–34.0)
MCHC: 30.4 g/dL (ref 30.0–36.0)
MCV: 88.8 fL (ref 80.0–100.0)
Platelets: 419 10*3/uL — ABNORMAL HIGH (ref 150–400)
RBC: 4.11 MIL/uL — ABNORMAL LOW (ref 4.22–5.81)
RDW: 13.4 % (ref 11.5–15.5)
WBC: 18.9 10*3/uL — ABNORMAL HIGH (ref 4.0–10.5)
nRBC: 0 % (ref 0.0–0.2)

## 2019-03-16 MED ORDER — LEVOFLOXACIN IN D5W 500 MG/100ML IV SOLN: 500.0000 mg | Freq: Once | INTRAVENOUS | Status: DC

## 2019-03-16 MED ORDER — FLUCONAZOLE IN SODIUM CHLORIDE 200-0.9 MG/100ML-% IV SOLN
INTRAVENOUS | Status: AC
  Filled 2019-03-16: qty 100

## 2019-03-16 NOTE — Progress Notes (Addendum)
PROGRESS NOTE    Roger Gardner  [id number]  DOB: July 11, 1922  DOA: 03/15/2019 PCP: [name], NP   Brief Admission Hx: 85 year old male with dementia living at home with family and caretakers presented with gross hematuria and ental status changes.  MDM/Assessment & Plan:   1. Acute renal failure- markedly elevated creatinine likely secondary to dehydration, antibiotics and will investigate further for obstruction.  Obtain renal ultrasound.  Gently hydrate with IV fluids.  Renally dose medications.  Follow daily renal function panel. 2. Gross hematuria- likely secondary to trauma associated with Foley catheter, apixaban with acute renal failure- continue to follow closely and if persists will ask urology to see.  Renal ultrasound pending. 3. UTI-patient had been treated with Bactrim double strength tablets prior to admission.  This has been discontinued.  Budding yeast noted on urine microscopy and has been started on fluconazole.  Follow urine culture. 4. History of right lower extremity DVT and had been on apixaban for approximately 3 weeks.  Unfortunately will have to hold apixaban and give patient a drug holiday given markedly elevated creatinine.  Monitor closely. 5. Senile dementia-daughter reports that she will stay at bedside to assist patient with orientation and confusion and to help to redirect as necessary. 6. Leukocytosis-reactive-follow blood cultures and urine culture.  WBC trending down with treatments.  Follow. 7. Hyperkalemia-secondary to acute renal failure- treated with Kayexalate and improving.  Follow closely.  No EKG findings of peaked T waves. 8. Dysphagia -daughter reports that patient does have difficulty at times with swallowing.  I have asked for an SLP evaluation and have placed him on a dysphasia 2 diet pending SLP evaluation.  DVT prophylaxis: SCDs Code Status: DNR Family Communication: Daughter at bedside Disposition Plan: Inpatient   Consultants:    Procedures:    Antimicrobials:     Subjective: Patient appears comfortable he is with significant dementia  Objective: Vitals:   03/15/19 2215 03/15/19 2254 03/15/19 2300 03/16/19 0618  BP: (!) 150/79  (!) 116/56 129/81  Pulse:   95 98  Resp:      Temp:   (!) 97.5 F (36.4 C) 99.1 F (37.3 C)  TempSrc:   Axillary Oral  SpO2:   93% 94%  Weight:  67.9 kg    Height:  5\' 6"  (1.676 m)      Intake/Output Summary (Last 24 hours) at 03/16/2019 1059 Last data filed at 03/16/2019 0600 Gross per 24 hour  Intake 236.46 ml  Output 1700 ml  Net -1463.54 ml   Filed Weights   03/15/19 1628 03/15/19 2254  Weight: 74.8 kg 67.9 kg   REVIEW OF SYSTEMS  As per history otherwise all reviewed and reported negative  Exam:  General exam: Elderly male with dementia lying in bed resting comfortably in no distress. Respiratory system: Bilateral breath sounds.  No increased work of breathing. Cardiovascular system: S1 & S2 heard. No JVD, murmurs, gallops, clicks or pedal edema. Gastrointestinal system: Abdomen is nondistended, soft and nontender. Normal bowel sounds heard. GU: Foley with gross hematuria seen. Central nervous system: With dementia. No focal neurological deficits. Extremities: no CCE.  Data Reviewed: Basic Metabolic Panel: Recent Labs  Lab 03/15/19 1647 03/16/19 0604  NA 139 145  K 5.9* 5.0  CL 104 112*  CO2 22 24  GLUCOSE 130* 86  BUN 84* 74*  CREATININE 5.25* 3.57*  CALCIUM 9.5 8.9   Liver Function Tests: Recent Labs  Lab 03/15/19 1647 03/16/19 0604  AST 30 24  ALT  21 19  ALKPHOS 76 65  BILITOT 0.7 0.6  PROT 7.4 6.6  ALBUMIN 3.1* 2.6*   No results for input(s): LIPASE, AMYLASE in the last 168 hours. No results for input(s): AMMONIA in the last 168 hours. CBC: Recent Labs  Lab 03/15/19 1647 03/16/19 0604  WBC 25.2* 18.9*  NEUTROABS 21.5*  --   HGB 12.8* 11.1*  HCT 41.3 36.5*  MCV 86.6 88.8  PLT 443* 419*   Cardiac  Enzymes: No results for input(s): CKTOTAL, CKMB, CKMBINDEX, TROPONINI in the last 168 hours. CBG (last 3)  No results for input(s): GLUCAP in the last 72 hours. Recent Results (from the past 240 hour(s))  Blood culture (routine x 2)     Status: None (Preliminary result)   Collection Time: 03/15/19  7:25 PM   Specimen: BLOOD RIGHT WRIST  Result Value Ref Range Status   Specimen Description BLOOD RIGHT WRIST  Final   Special Requests   Final    BOTTLES DRAWN AEROBIC AND ANAEROBIC Blood Culture adequate volume Performed at [facility], [address], [place], [place] [address]    Culture PENDING  Incomplete   Report Status PENDING  Incomplete  Blood culture (routine x 2)     Status: None (Preliminary result)   Collection Time: 03/15/19  7:41 PM   Specimen: BLOOD LEFT ARM  Result Value Ref Range Status   Specimen Description BLOOD LEFT ARM  Final   Special Requests   Final    BOTTLES DRAWN AEROBIC ONLY Blood Culture adequate volume Performed at [facility], [address] [place], [place] [address]    Culture PENDING  Incomplete   Report Status PENDING  Incomplete  SARS Coronavirus 2 [facility] order, Performed in [facility] hospital lab) Nasopharyngeal Nasopharyngeal Swab     Status: None   Collection Time: 03/15/19  8:30 PM   Specimen: Nasopharyngeal Swab  Result Value Ref Range Status   SARS Coronavirus 2 NEGATIVE NEGATIVE Final    Comment: (NOTE) If result is NEGATIVE SARS-CoV-2 target nucleic acids are NOT DETECTED. The SARS-CoV-2 RNA is generally detectable in upper and lower  respiratory specimens during the acute phase of infection. The lowest  concentration of SARS-CoV-2 viral copies this assay can detect is 250  copies / mL. A negative result does not preclude SARS-CoV-2 infection  and should not be used as the sole basis for treatment or other  patient management decisions.  A negative result may occur with  improper specimen collection / handling, submission of  specimen other  than nasopharyngeal swab, presence of viral mutation(s) within the  areas targeted by this assay, and inadequate number of viral copies  (<250 copies / mL). A negative result must be combined with clinical  observations, patient history, and epidemiological information. If result is POSITIVE SARS-CoV-2 target nucleic acids are DETECTED. The SARS-CoV-2 RNA is generally detectable in upper and lower  respiratory specimens dur ing the acute phase of infection.  Positive  results are indicative of active infection with SARS-CoV-2.  Clinical  correlation with patient history and other diagnostic information is  necessary to determine patient infection status.  Positive results do  not rule out bacterial infection or co-infection with other viruses. If result is PRESUMPTIVE POSTIVE SARS-CoV-2 nucleic acids MAY BE PRESENT.   A presumptive positive result was obtained on the submitted specimen  and confirmed on repeat testing.  While 2019 novel coronavirus  (SARS-CoV-2) nucleic acids may be present in the submitted sample  additional confirmatory testing may  be necessary for epidemiological  and / or clinical management purposes  to differentiate between  SARS-CoV-2 and other Sarbecovirus currently known to infect humans.  If clinically indicated additional testing with an alternate test  methodology [phone]) is advised. The SARS-CoV-2 RNA is generally  detectable in upper and lower respiratory sp ecimens during the acute  phase of infection. The expected result is Negative. Fact Sheet for Patients:  StrictlyIdeas.no Fact Sheet for Healthcare Providers: BankingDealers.co.za This test is not yet approved or cleared by the [place] FDA and has been authorized for detection and/or diagnosis of SARS-CoV-2 by FDA under an Emergency Use Authorization (EUA).  This EUA will remain in effect (meaning this test can be used) for the  duration of the COVID-19 declaration under Section 564(b)(1) of the Act, 21 U.S.C. section 360bbb-3(b)(1), unless the authorization is terminated or revoked sooner. Performed at [facility], [address] [place], [place] [address]      Studies: Ct Head Wo Contrast  Result Date: 03/15/2019 CLINICAL DATA:  85 year old with dementia. Focal neuro deficit, > 6 hrs, stroke suspected EXAM: CT HEAD WITHOUT CONTRAST TECHNIQUE: Contiguous axial images were obtained from the base of the skull through the vertex without intravenous contrast. COMPARISON:  Remote head CT 12/27/2009 FINDINGS: Brain: No intracranial hemorrhage, mass effect, or midline shift. Generalized atrophy, progressed from 2011. Remote lacunar infarct in the left basal ganglia and periventricular white matter. Advanced chronic small vessel ischemia. No hydrocephalus. The basilar cisterns are patent. No evidence of territorial infarct or acute ischemia. No extra-axial or intracranial fluid collection. Vascular: Atherosclerosis of skullbase vasculature without hyperdense vessel or abnormal calcification. Skull: No fracture or focal lesion. Sinuses/Orbits: Mild mucosal thickening of right maxillary sinus with tiny mucous retention cysts. No acute findings. Bilateral cataract resection. Other: None. IMPRESSION: 1. No acute intracranial abnormality. 2. Generalized atrophy and advanced chronic small vessel ischemia. Remote lacunar infarcts in the left basal ganglia and periventricular white matter. Electronically Signed   By: Keith Rake M.D.   On: 03/15/2019 20:39   Dg Chest Portable 1 View  Result Date: 03/15/2019 CLINICAL DATA:  Altered mental status.  History of asbestosis EXAM: PORTABLE CHEST 1 VIEW COMPARISON:  06/16/2016 FINDINGS: Mild cardiomegaly. No confluent opacities, effusions or edema. No acute bony abnormality. IMPRESSION: Mild cardiomegaly.  No active disease. Electronically Signed   By: Rolm Baptise M.D.   On: 03/15/2019 18:18    Ct Renal Stone Study  Result Date: 03/15/2019 CLINICAL DATA:  Hematuria EXAM: CT ABDOMEN AND PELVIS WITHOUT CONTRAST TECHNIQUE: Multidetector CT imaging of the abdomen and pelvis was performed following the standard protocol without IV contrast. COMPARISON:  None. FINDINGS: Lower chest: Bibasilar atelectasis. Heart is mildly enlarged. Moderate-sized hiatal hernia. Hepatobiliary: No focal hepatic abnormality. Gallbladder unremarkable. Pancreas: No focal abnormality or ductal dilatation. Spleen: No focal abnormality.  Normal size. Adrenals/Urinary Tract: Bilateral nephrolithiasis with largest stone in the right midpole measuring 6 mm. Mild fullness of the renal collecting systems bilaterally. No ureteral stones. Urinary bladder is decompressed with Foley catheter in place. Bladder wall is difficult to evaluate. Adrenal glands unremarkable. Stomach/Bowel: Sigmoid diverticulosis. No active diverticulitis. Stomach and small bowel decompressed, grossly unremarkable. Vascular/Lymphatic: Aortic atherosclerosis. No enlarged abdominal or pelvic lymph nodes. Reproductive: Prostate enlargement. Other: No free fluid or free air. Musculoskeletal: No acute bony abnormality. IMPRESSION: There is mild fullness of the renal collecting system and ureters bilaterally without ureteral stone. Bilateral nephrolithiasis. Sigmoid diverticulosis. Aortic atherosclerosis. Prostate enlargement. Electronically Signed   By: Rolm Baptise  M.D.   On: 03/15/2019 22:52   Scheduled Meds: Continuous Infusions: . sodium chloride 60 mL/hr at 03/16/19 1052  . fluconazole (DIFLUCAN) IV 100 mg (03/16/19 0031)    Principal Problem:   Gross Hematuria Active Problems:   Dehydration   Yeast cystitis   Acute renal failure (HCC)   Dementia (HCC)   Hyperkalemia   Bilateral nephrolithiasis   Long term current use of anticoagulant therapy  Time spent:   Irwin Brakeman, MD [facility] 03/16/2019, 10:59 AM    LOS: 1 day  How to  contact the [facility] Attending or Consulting provider [place] or covering provider during after hours [place], for this patient?  1. Check the care team in [facility] and look for a) attending/consulting TR[facility] provider listed and b) the [facility] team listed 2. Log into [email or web address] and use [place]'s universal password to access. If you do not have the password, please contact the hospital operator. 3. Locate the [facility] provider you are looking for under [facility] and page to a number that you can be directly reached. 4. If you still have difficulty reaching the provider, please page the [facility] (Director on Call) for the Hospitalists listed on amion for assistance.

## 2019-03-17 LAB — RENAL FUNCTION PANEL
Albumin: 2.6 g/dL — ABNORMAL LOW (ref 3.5–5.0)
Anion gap: 8 (ref 5–15)
BUN: 61 mg/dL — ABNORMAL HIGH (ref 8–23)
CO2: 23 mmol/L (ref 22–32)
Calcium: 8.9 mg/dL (ref 8.9–10.3)
Chloride: 115 mmol/L — ABNORMAL HIGH (ref 98–111)
Creatinine, Ser: 2.16 mg/dL — ABNORMAL HIGH (ref 0.61–1.24)
GFR calc Af Amer: 29 mL/min — ABNORMAL LOW (ref >60)
GFR calc non Af Amer: 25 mL/min — ABNORMAL LOW (ref >60)
Glucose, Bld: 78 mg/dL (ref 70–99)
Phosphorus: 3.6 mg/dL (ref 2.5–4.6)
Potassium: 4.5 mmol/L (ref 3.5–5.1)
Sodium: 146 mmol/L — ABNORMAL HIGH (ref 135–145)

## 2019-03-17 LAB — CBC
HCT: 36.8 % — ABNORMAL LOW (ref 39.0–52.0)
Hemoglobin: 10.9 g/dL — ABNORMAL LOW (ref 13.0–17.0)
MCH: 26.8 pg (ref 26.0–34.0)
MCHC: 29.6 g/dL — ABNORMAL LOW (ref 30.0–36.0)
MCV: 90.6 fL (ref 80.0–100.0)
Platelets: 428 10*3/uL — ABNORMAL HIGH (ref 150–400)
RBC: 4.06 MIL/uL — ABNORMAL LOW (ref 4.22–5.81)
RDW: 13.4 % (ref 11.5–15.5)
WBC: 13.6 10*3/uL — ABNORMAL HIGH (ref 4.0–10.5)
nRBC: 0 % (ref 0.0–0.2)

## 2019-03-17 LAB — URINE CULTURE

## 2019-03-17 LAB — MAGNESIUM: Magnesium: 2.3 mg/dL (ref 1.7–2.4)

## 2019-03-17 MED ORDER — RESOURCE THICKENUP CLEAR PO POWD
ORAL | Status: DC | PRN
  Filled 2019-03-17: qty 125

## 2019-03-17 MED ORDER — FLUCONAZOLE 100 MG PO TABS
100.0000 mg | ORAL_TABLET | Freq: Every day | ORAL | Status: DC
  Administered 2019-03-17: 21:00:00 100 mg via ORAL
  Filled 2019-03-17: qty 1

## 2019-03-17 MED ORDER — SODIUM CHLORIDE 0.45 % IV SOLN
INTRAVENOUS | Status: DC
  Administered 2019-03-17 – 2019-03-18 (×2): via INTRAVENOUS

## 2019-03-17 MED ORDER — HALOPERIDOL LACTATE 5 MG/ML IJ SOLN: 2.0000 mg | Freq: Once | INTRAMUSCULAR | Status: DC

## 2019-03-17 NOTE — Progress Notes (Addendum)
Home Health Care Choices :  [place] [phone]  [place] my Favorites Quality of Patient Care Rating 4 out of 5 stars Patient Survey Summary Rating 4 out of [place] [phone]  [place] my Favorites Quality of Patient Care Rating 3 out of 5 stars Patient Survey Summary Rating 5 out of [place] [phone]  [place] my Favorites Quality of Patient Care Rating 3 out of 5 stars Patient Survey Summary Rating 4 out of [place] [phone]  [place] my Favorites Quality of Patient Care Rating 3  out of 5 stars Patient Survey Summary Rating 4 out of 5 stars [place] [phone]  Add [facility] my Favorites Quality of Patient Care Rating 4  out of 5 stars Patient Survey Summary Rating 3 out of 5 stars [place] [phone]  [place], [facility] my Favorites Quality of Patient Care Rating 4 out of 5 stars Patient Survey Summary Rating 4 out of 5 stars [place] [phone]  [place], [facility]to my Favorites Quality of Patient Care Rating 4 out of 5 stars Patient Survey Summary Rating 4 out of 5 stars [place] [phone]  [place] my Favorites Quality of Patient Care Rating 4 out of 5 stars Patient Survey Summary Rating 4 out of 5 stars [place] AGE [phone]  [place] my Favorites Quality of Patient Care Rating 3 out of 5 stars Patient Survey Summary Rating 3 out of 5 stars [facility] [place] [phone]  Add [facility] [place] my Favorites Quality of Patient Care Rating 3  out of 5 stars Patient Survey Summary Rating 4 out of 5 stars [place] [phone]  [place] my Favorites Quality of Patient Care Rating 3 out of 5 stars Patient Survey Summary Rating 4 out of 5 stars [facility] [phone]  Add [facility] my Favor[facility] Quality of Patient Care Rating 3  out of 5 stars Patient Survey Summary Rating 3 out of 5 stars [place] [phone]  Add [facility] my Favorites Quality of Patient Care Rating 3  out of 5 stars Not [place] [phone]  [place] my Favorites Quality of Patient Care Rating   DME-  Choices   DME Choices: [facility] [address] [place], [place]  [place], [place] [address] [phone] INC [place], [place] [address] [phone]

## 2019-03-17 NOTE — TOC Initial Note (Signed)
Transition of Care [facility]) - Initial/Assessment Note    Patient Details  Name: Roger Gardner MRN: [id number] Date of Birth: Dec 04, 1921  Transition of Care [facility]) CM/SW Contact:    Boneta Lucks, RN Phone Number: 03/17/2019, 11:07 AM  Clinical Narrative:       Patient admitted with gross hematuria. Spoke with [name] - daughter. Patient lives with her son and his wife which is a Therapist, sports at home now with a baby. Plan for patient to discharge tomorrow. [name] is very upset, patient is active with [facility] for RN to maintain foley catheter.  Debra called PCP to get an order and call [facility] two weeks ago, complaining of cloudy urine and past 30 days for catheter change. Nurse came to the home but did not change the catheter. Patient is weak and dehydrated.  They wish for TOC to set him up with a different [place]. Vaughan Basta - with [place] took the referral. Daughter states he will need a wheelchair. Juliann Pulse - with [place] took the referral. Per daughter patient will need EMS transport at discharge.  They have steps to get into the home, due to weakness they will not be able to get him in the house. CM called [facility] to discharge their services. [name] updated with discharge plan. Placed or AVS.          Expected Discharge Plan: [place]     Patient Goals and CMS Choice Patient states their goals for this hospitalization and ongoing recovery are:: to go back home. CMS Medicare.gov Compare Post Acute Care list provided to:: Patient Represenative (must comment)(debra - daughter) Choice offered to / list presented to : Adult Children  Expected Discharge Plan and Services Expected Discharge Plan: [place]   Discharge Planning Services: CM Consult Post Acute Care Choice: Home Health, Durable Medical Equipment                   DME Arranged: Wheelchair manual DME Agency: [facility] Date DME Agency Contacted: 03/17/19 Time DME Agency  Contacted: [id number] Representative spoke with at DME Agency: [place]: PT, RN [facility] Agency: [place] ([place]) Date [place]: 03/17/19 Time [place]: 65 Representative spoke with at [place]: [place] Arrangements/Services   Lives with:: Spouse, Adult Children Patient language and need for interpreter reviewed:: Yes Do you feel safe going back to the place where you live?: Yes      Need for Family Participation in Patient Care: Yes (Comment) Care giver support system in place?: Yes (comment) Current home services: DME, Home RN Criminal Activity/Legal Involvement Pertinent to Current Situation/Hospitalization: No - Comment as needed  Activities of Daily Living Home Assistive Devices/Equipment: Cane (specify quad or straight), Walker (specify type) ADL Screening (condition at time of admission) Patient's cognitive ability adequate to safely complete daily activities?: No Is the patient deaf or have difficulty hearing?: No Does the patient have difficulty seeing, even when wearing glasses/contacts?: No Does the patient have difficulty concentrating, remembering, or making decisions?: Yes Patient able to express need for assistance with ADLs?: No Does the patient have difficulty dressing or bathing?: Yes Independently performs ADLs?: No Communication: Dependent Is this a change from baseline?: Pre-admission baseline Dressing (OT): Dependent Is this a change from baseline?: Pre-admission baseline Grooming: Dependent Is this a change from baseline?: Pre-admission baseline Feeding: Dependent Is this a change from baseline?: Pre-admission baseline Bathing: Dependent Is this a change  from baseline?: Pre-admission baseline Toileting: Dependent Is this a change from baseline?: Pre-admission baseline In/Out Bed: Dependent Is this a change from baseline?: Pre-admission baseline Walks in Home: Dependent Is this a change  from baseline?: Pre-admission baseline Does the patient have difficulty walking or climbing stairs?: Yes Weakness of Legs: Both Weakness of Arms/Hands: None  Permission Sought/Granted Permission sought to share information with : Family Supports Permission granted to share information with : Yes, Verbal Permission Granted        Permission granted to share info w Relationship: [name] - daughter  Permission granted to share info w Contact Information: HH/ DME  Emotional Assessment         Alcohol / Substance Use: Not Applicable Psych Involvement: No (comment)  Admission diagnosis:  Dehydration [E86.0] Hemorrhagic cystitis [N30.91] Yeast cystitis [B37.41] Acute renal failure, unspecified acute renal failure type [facility]) [N17.9] Patient Active Problem List   Diagnosis Date Noted  . Dehydration 03/16/2019  . Yeast cystitis 03/16/2019  . Acute renal failure ([place]) 03/16/2019  . Dementia ([place])   . Hyperkalemia   . Bilateral nephrolithiasis   . Long term current use of anticoagulant therapy   . Gross Hematuria 03/15/2019   PCP:  [name], NP Pharmacy:   [place], [place] [id number] [place] [id number] Phone: [phone] Fax: [phone]         Readmission Risk Interventions No flowsheet data found.

## 2019-03-17 NOTE — Progress Notes (Signed)
PROGRESS NOTE    Roger Gardner  [id number]  DOB: [birth year]  DOA: 03/15/2019 PCP: [name], NP   Brief Admission Hx: 85 year old male with dementia living at home with family and caretakers presented with gross hematuria and ental status changes.  MDM/Assessment & Plan:   1. Acute renal failure- markedly elevated creatinine is improving with gentle hydration. Renal ultrasound with no hydronephrosis seen.   Renally dose medications. Recheck in AM. If continues to trend down can DC home.  2. Gross hematuria- resolved now. Was likely secondary to trauma associated with Foley catheter, apixaban with acute renal failure- continue to follow closely and if persists will ask urology to see.  3. UTI-patient had been treated with Bactrim double strength tablets prior to admission.  This has been discontinued.  Budding yeast noted on urine microscopy and has been started on fluconazole.  Follow urine culture. 4. History of right lower extremity DVT and had been on apixaban for approximately 6 weeks.  Unfortunately will have to hold apixaban and give patient a drug holiday given markedly elevated creatinine.  Place TED hoses.  Family to discuss and consider risks/benefits of restarting apixaban.   5. Senile dementia-daughter has been staying at bedside to assist patient with orientation and confusion and to help to redirect as necessary. 6. Leukocytosis-reactive-follow blood cultures and urine culture.  WBC trending down with treatments. No s/s of infection.  7. Hyperkalemia-Resolved.  Secondary to acute renal failure- treated with Kayexalate and improving.  No EKG findings of peaked T waves. 8. Dysphagia -daughter reports that patient does have difficulty at times with swallowing.  I have asked for an SLP evaluation.  DVT prophylaxis: SCDs Code Status: DNR Family Communication: Daughter at bedside Disposition Plan: Inpatient  Consultants:  SLP    Procedures:    Antimicrobials:     Subjective: Patient appears comfortable he is with significant dementia  Objective: Vitals:   03/16/19 1445 03/16/19 2111 03/17/19 0442 03/17/19 0826  BP: 130/65 138/61 (!) 150/67   Pulse: 95 93 85   Resp: 18 18 19    Temp: 98.2 F (36.8 C) 99 F (37.2 C) 98.5 F (36.9 C)   TempSrc: Oral Oral Oral   SpO2: 97% 95% 95% 96%  Weight:      Height:        Intake/Output Summary (Last 24 hours) at 03/17/2019 1247 Last data filed at 03/17/2019 0900 Gross per 24 hour  Intake 289.68 ml  Output 2350 ml  Net -2060.32 ml   Filed Weights   03/15/19 1628 03/15/19 2254  Weight: 74.8 kg 67.9 kg   REVIEW OF SYSTEMS  As per history otherwise all reviewed and reported negative  Exam:  General exam: Elderly male with dementia lying in bed resting comfortably in no distress. Respiratory system: Bilateral breath sounds.  No increased work of breathing. Cardiovascular system: S1 & S2 heard. No JVD, murmurs, gallops, clicks or pedal edema. Gastrointestinal system: Abdomen is nondistended, soft and nontender. Normal bowel sounds heard. GU: Foley with gross hematuria seen. Central nervous system: With dementia. No focal neurological deficits. Extremities: no CCE.  Data Reviewed: Basic Metabolic Panel: Recent Labs  Lab 03/15/19 1647 03/16/19 0604 03/17/19 0422 03/17/19 0423  NA 139 145  --  146*  K 5.9* 5.0  --  4.5  CL 104 112*  --  115*  CO2 22 24  --  23  GLUCOSE 130* 86  --  78  BUN 84* 74*  --  61*  CREATININE 5.25*  3.57*  --  2.16*  CALCIUM 9.5 8.9  --  8.9  MG  --   --  2.3  --   PHOS  --   --   --  3.6   Liver Function Tests: Recent Labs  Lab 03/15/19 1647 03/16/19 0604 03/17/19 0423  AST 30 24  --   ALT 21 19  --   ALKPHOS 76 65  --   BILITOT 0.7 0.6  --   PROT 7.4 6.6  --   ALBUMIN 3.1* 2.6* 2.6*   No results for input(s): LIPASE, AMYLASE in the last 168 hours. No results for input(s): AMMONIA in the last 168  hours. CBC: Recent Labs  Lab 03/15/19 1647 03/16/19 0604 03/17/19 0422  WBC 25.2* 18.9* 13.6*  NEUTROABS 21.5*  --   --   HGB 12.8* 11.1* 10.9*  HCT 41.3 36.5* 36.8*  MCV 86.6 88.8 90.6  PLT 443* 419* 428*   Cardiac Enzymes: No results for input(s): CKTOTAL, CKMB, CKMBINDEX, TROPONINI in the last 168 hours. CBG (last 3)  No results for input(s): GLUCAP in the last 72 hours. Recent Results (from the past 240 hour(s))  Blood culture (routine x 2)     Status: None (Preliminary result)   Collection Time: 03/15/19  7:25 PM   Specimen: BLOOD RIGHT WRIST  Result Value Ref Range Status   Specimen Description BLOOD RIGHT WRIST  Final   Special Requests   Final    BOTTLES DRAWN AEROBIC AND ANAEROBIC Blood Culture adequate volume   Culture   Final    NO GROWTH 2 DAYS Performed at [facility], [address], [place], [place] [address]    Report Status PENDING  Incomplete  Blood culture (routine x 2)     Status: None (Preliminary result)   Collection Time: 03/15/19  7:41 PM   Specimen: BLOOD LEFT ARM  Result Value Ref Range Status   Specimen Description BLOOD LEFT ARM  Final   Special Requests   Final    BOTTLES DRAWN AEROBIC ONLY Blood Culture adequate volume   Culture   Final    NO GROWTH 2 DAYS Performed at [facility], [address] [place], [place] [address]    Report Status PENDING  Incomplete  Urine Culture     Status: None   Collection Time: 03/15/19  8:01 PM   Specimen: Urine, Catheterized  Result Value Ref Range Status   Specimen Description   Final    URINE, CATHETERIZED Performed at [facility], [address], [place], [place] [address]    Special Requests   Final    NONE Performed at [facility], [address], [place], [place] [address]    Culture   Final    Multiple bacterial morphotypes present, none predominant. Suggest appropriate recollection if clinically indicated.   Report Status 03/17/2019 FINAL  Final  SARS Coronavirus 2 [facility] order,  Performed in [facility] hospital lab) Nasopharyngeal Nasopharyngeal Swab     Status: None   Collection Time: 03/15/19  8:30 PM   Specimen: Nasopharyngeal Swab  Result Value Ref Range Status   SARS Coronavirus 2 NEGATIVE NEGATIVE Final    Comment: (NOTE) If result is NEGATIVE SARS-CoV-2 target nucleic acids are NOT DETECTED. The SARS-CoV-2 RNA is generally detectable in upper and lower  respiratory specimens during the acute phase of infection. The lowest  concentration of SARS-CoV-2 viral copies this assay can detect is 250  copies / mL. A negative result does not preclude SARS-CoV-2 infection  and should not be used as the sole basis for treatment or other  patient management decisions.  A negative result may occur with  improper specimen collection / handling, submission of specimen other  than nasopharyngeal swab, presence of viral mutation(s) within the  areas targeted by this assay, and inadequate number of viral copies  (<250 copies / mL). A negative result must be combined with clinical  observations, patient history, and epidemiological information. If result is POSITIVE SARS-CoV-2 target nucleic acids are DETECTED. The SARS-CoV-2 RNA is generally detectable in upper and lower  respiratory specimens dur ing the acute phase of infection.  Positive  results are indicative of active infection with SARS-CoV-2.  Clinical  correlation with patient history and other diagnostic information is  necessary to determine patient infection status.  Positive results do  not rule out bacterial infection or co-infection with other viruses. If result is PRESUMPTIVE POSTIVE SARS-CoV-2 nucleic acids MAY BE PRESENT.   A presumptive positive result was obtained on the submitted specimen  and confirmed on repeat testing.  While 2019 novel coronavirus  (SARS-CoV-2) nucleic acids may be present in the submitted sample  additional confirmatory testing may be necessary for epidemiological  and / or  clinical management purposes  to differentiate between  SARS-CoV-2 and other Sarbecovirus currently known to infect humans.  If clinically indicated additional testing with an alternate test  methodology [phone]) is advised. The SARS-CoV-2 RNA is generally  detectable in upper and lower respiratory sp ecimens during the acute  phase of infection. The expected result is Negative. Fact Sheet for Patients:  StrictlyIdeas.no Fact Sheet for Healthcare Providers: BankingDealers.co.za This test is not yet approved or cleared by the [place] FDA and has been authorized for detection and/or diagnosis of SARS-CoV-2 by FDA under an Emergency Use Authorization (EUA).  This EUA will remain in effect (meaning this test can be used) for the duration of the COVID-19 declaration under Section 564(b)(1) of the Act, 21 U.S.C. section 360bbb-3(b)(1), unless the authorization is terminated or revoked sooner. Performed at [facility], [address], [place], [place] [address]      Studies: Ct Head Wo Contrast  Result Date: 03/15/2019 CLINICAL DATA:  85 year old with dementia. Focal neuro deficit, > 6 hrs, stroke suspected EXAM: CT HEAD WITHOUT CONTRAST TECHNIQUE: Contiguous axial images were obtained from the base of the skull through the vertex without intravenous contrast. COMPARISON:  Remote head CT 12/27/2009 FINDINGS: Brain: No intracranial hemorrhage, mass effect, or midline shift. Generalized atrophy, progressed from 2011. Remote lacunar infarct in the left basal ganglia and periventricular white matter. Advanced chronic small vessel ischemia. No hydrocephalus. The basilar cisterns are patent. No evidence of territorial infarct or acute ischemia. No extra-axial or intracranial fluid collection. Vascular: Atherosclerosis of skullbase vasculature without hyperdense vessel or abnormal calcification. Skull: No fracture or focal lesion. Sinuses/Orbits: Mild  mucosal thickening of right maxillary sinus with tiny mucous retention cysts. No acute findings. Bilateral cataract resection. Other: None. IMPRESSION: 1. No acute intracranial abnormality. 2. Generalized atrophy and advanced chronic small vessel ischemia. Remote lacunar infarcts in the left basal ganglia and periventricular white matter. Electronically Signed   By: Keith Rake M.D.   On: 03/15/2019 20:39   US Renal  Result Date: 03/16/2019 CLINICAL DATA:  Acute kidney injury. EXAM: RENAL / URINARY TRACT ULTRASOUND COMPLETE COMPARISON:  03/15/2019 CT FINDINGS: Right Kidney: Renal measurements: 12.2 x 3.9 x 4.3 centimeters = volume: 107 mL. 5 millimeter calculus is identified in the LOWER pole region.  A cyst in the LOWER pole is 9 millimeters. No hydronephrosis or solid renal mass. There is thinning of the renal parenchyma. Parenchyma appears echogenic. Left Kidney: Renal measurements: 11.5 x 6.5 x 4.5 centimeters = volume: 164 mL. Echogenicity within normal limits. No mass or hydronephrosis visualized. Bladder: Foley catheter decompresses the bladder. IMPRESSION: 1. No hydronephrosis. 2. RIGHT renal parenchyma appears slightly more echogenic and thinned compared to the LEFT kidney. 3. 5 millimeter calculus in the LOWER pole the RIGHT kidney. Benign RIGHT renal cyst. Electronically Signed   By: Nolon Nations M.D.   On: 03/16/2019 12:19   Dg Chest Portable 1 View  Result Date: 03/15/2019 CLINICAL DATA:  Altered mental status.  History of asbestosis EXAM: PORTABLE CHEST 1 VIEW COMPARISON:  06/16/2016 FINDINGS: Mild cardiomegaly. No confluent opacities, effusions or edema. No acute bony abnormality. IMPRESSION: Mild cardiomegaly.  No active disease. Electronically Signed   By: Rolm Baptise M.D.   On: 03/15/2019 18:18   Ct Renal Stone Study  Result Date: 03/15/2019 CLINICAL DATA:  Hematuria EXAM: CT ABDOMEN AND PELVIS WITHOUT CONTRAST TECHNIQUE: Multidetector CT imaging of the abdomen and pelvis was  performed following the standard protocol without IV contrast. COMPARISON:  None. FINDINGS: Lower chest: Bibasilar atelectasis. Heart is mildly enlarged. Moderate-sized hiatal hernia. Hepatobiliary: No focal hepatic abnormality. Gallbladder unremarkable. Pancreas: No focal abnormality or ductal dilatation. Spleen: No focal abnormality.  Normal size. Adrenals/Urinary Tract: Bilateral nephrolithiasis with largest stone in the right midpole measuring 6 mm. Mild fullness of the renal collecting systems bilaterally. No ureteral stones. Urinary bladder is decompressed with Foley catheter in place. Bladder wall is difficult to evaluate. Adrenal glands unremarkable. Stomach/Bowel: Sigmoid diverticulosis. No active diverticulitis. Stomach and small bowel decompressed, grossly unremarkable. Vascular/Lymphatic: Aortic atherosclerosis. No enlarged abdominal or pelvic lymph nodes. Reproductive: Prostate enlargement. Other: No free fluid or free air. Musculoskeletal: No acute bony abnormality. IMPRESSION: There is mild fullness of the renal collecting system and ureters bilaterally without ureteral stone. Bilateral nephrolithiasis. Sigmoid diverticulosis. Aortic atherosclerosis. Prostate enlargement. Electronically Signed   By: Rolm Baptise M.D.   On: 03/15/2019 22:52   Scheduled Meds:  fluconazole  100 mg Oral QHS   haloperidol lactate  2 mg Intravenous Once   Continuous Infusions:  sodium chloride 60 mL/hr at 03/17/19 [id number]    Principal Problem:   Gross Hematuria Active Problems:   Dehydration   Yeast cystitis   Acute renal failure (HCC)   Dementia (HCC)   Hyperkalemia   Bilateral nephrolithiasis   Long term current use of anticoagulant therapy  Time spent:   Irwin Brakeman, MD [facility] 03/17/2019, 12:47 PM    LOS: 2 days  How to contact the [facility] Attending or Consulting provider [place] or covering provider during after hours [place], for this patient?  1. Check the care team in [facility] and  look for a) attending/consulting TRH provider listed and b) the [facility] team listed 2. Log into [email or web address] and use [place]'s universal password to access. If you do not have the password, please contact the hospital operator. 3. Locate the [facility] provider you are looking for under [facility] and page to a number that you can be directly reached. 4. If you still have difficulty reaching the provider, please page the [facility] (Director on Call) for the Hospitalists listed on amion for assistance.

## 2019-03-17 NOTE — Care Management Important Message (Signed)
Important Message  Patient Details  Name: Roger Gardner MRN: [id number] Date of Birth: [birth year]   Medicare Important Message Given:  Yes     Tommy Medal 03/17/2019, 10:12 AM

## 2019-03-17 NOTE — Evaluation (Signed)
Clinical/Bedside Swallow Evaluation Patient Details  Name: Roger Gardner MRN: [id number] Date of Birth: 05/01/1922  Today's Date: 03/17/2019 Time: SLP Start Time (ACUTE ONLY): 82 SLP Stop Time (ACUTE ONLY): 1200 SLP Time Calculation (min) (ACUTE ONLY): 30 min  Past Medical History:  Past Medical History:  Diagnosis Date  . 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 Dementia ([place])   . Flu 1999   Hospitalized by Dr. Lenord Fellers IM   Past Surgical History:  Past Surgical History:  Procedure Laterality Date  . HERNIA REPAIR    . KIDNEY STONE SURGERY     Basket extraction by Dr. Michela Pitcher    HPI:  85 year old male with dementia living at home with family and caretakers presented with gross hematuria and mental status changes. Acute renal failure- markedly elevated creatinine likely secondary to dehydration, antibiotics and will investigate further for obstruction. Family reports occasional difficulty with swallowing (coughing) and BSE requested. He lives at home with family and has dementia.    Assessment / Plan / Recommendation Clinical Impression  Clinical swallow evaluation completed at bedside with Pt's daughter present. She reports that Pt is not yet back to baseline, but is "much better" than yesterday. She indicates that Pt occasionally coughs when drinking thin liquids at home, but has no recent h/o PNA. Pt able to follow directions for oral motor examination, no gross facial asymmetry but mild generalized weakness. Pt with cognitive based dysphagia and suspected delay in swallow initiation with liquids. Pt is reportedly not yet back to baseline in terms of cognition (dementia but self feeds). Pt with prolonged oral transit and suspected delay in swallow initiation resulting in immediate and delayed coughing after cup sips of thin water. Pt with improved performance with teaspoon presentations of ice chips, water, and cup sips of NTL. Pt masticated graham cracker with cues for alertness  only. Recommend D3/mech soft and NTL via cup/straw and ok for ice chips and teaspoon presentations of thin water after oral care and when presented by staff or trained caregiver (daughter was present during this evaluation). SLP will follow tomorrow for ongoing diagnostic dysphagia intervention and determine if MBSS appropriate. Pt's daughter is in agreement with plan of care.   SLP Visit Diagnosis: Dysphagia, unspecified (R13.10)    Aspiration Risk  Mild aspiration risk;Moderate aspiration risk;Risk for inadequate nutrition/hydration    Diet Recommendation Dysphagia 3 (Mech soft);Nectar-thick liquid;Ice chips PRN after oral care;Free water protocol after oral care   Liquid Administration via: Cup;Straw Medication Administration: Whole meds with puree Supervision: Staff to assist with self feeding;Full supervision/cueing for compensatory strategies Compensations: Slow rate;Small sips/bites;Follow solids with liquid Postural Changes: Seated upright at 90 degrees;Remain upright for at least 30 minutes after po intake    Other  Recommendations Oral Care Recommendations: Oral care BID;Oral care prior to ice chip/H20;Staff/trained caregiver to provide oral care Other Recommendations: Order thickener from pharmacy;Prohibited food (jello, ice cream, thin soups);Clarify dietary restrictions   Follow up Recommendations (pending)      Frequency and Duration min 2x/week  1 week       Prognosis Prognosis for Safe Diet Advancement: Fair Barriers to Reach Goals: Cognitive deficits      Swallow Study   General Date of Onset: 03/15/19 HPI: 85 year old male with dementia living at home with family and caretakers presented with gross hematuria and mental status changes. Acute renal failure- markedly elevated creatinine likely secondary to dehydration, antibiotics and will investigate further for obstruction. Family reports occasional difficulty with swallowing (coughing)  and BSE requested. He lives at  home with family and has dementia.  Type of Study: Bedside Swallow Evaluation Previous Swallow Assessment: None on record Diet Prior to this Study: Dysphagia 2 (chopped);Nectar-thick liquids Temperature Spikes Noted: No Respiratory Status: Room air History of Recent Intubation: No Behavior/Cognition: Alert;Cooperative;Requires cueing Oral Cavity Assessment: Within Functional Limits Oral Care Completed by SLP: Recent completion by staff Oral Cavity - Dentition: Adequate natural dentition;Missing dentition Vision: Functional for self-feeding Self-Feeding Abilities: Needs assist;Needs set up Patient Positioning: Upright in bed Baseline Vocal Quality: Low vocal intensity Volitional Cough: Strong Volitional Swallow: Able to elicit    Oral/Motor/Sensory Function Overall Oral Motor/Sensory Function: Generalized oral weakness Facial ROM: Within Functional Limits Facial Symmetry: Within Functional Limits Facial Strength: Within Functional Limits Facial Sensation: Within Functional Limits Lingual ROM: Within Functional Limits Lingual Symmetry: Within Functional Limits Lingual Strength: Reduced Mandible: Within Functional Limits   Ice Chips Ice chips: Within functional limits Presentation: Spoon   Thin Liquid Thin Liquid: Impaired Presentation: Cup;Self Fed;Spoon Oral Phase Functional Implications: Oral holding;Prolonged oral transit Pharyngeal  Phase Impairments: Suspected delayed Swallow;Multiple swallows;Cough - Delayed;Cough - Immediate Other Comments: improved with tsp presentations    Nectar Thick Nectar Thick Liquid: Impaired Presentation: Cup;Straw Oral phase functional implications: Prolonged oral transit   Honey Thick Honey Thick Liquid: Not tested   Puree Puree: Within functional limits Presentation: Spoon;Self Fed   Solid     Solid: Impaired Oral Phase Functional Implications: Prolonged oral transit     Thank you,  Genene Churn,  [place] 03/17/2019,12:40 PM

## 2019-03-18 ENCOUNTER — Telehealth: Payer: Self-pay | Admitting: *Deleted

## 2019-03-18 DIAGNOSIS — F0280 Dementia in other diseases classified elsewhere without behavioral disturbance: Secondary | ICD-10-CM

## 2019-03-18 DIAGNOSIS — G309 Alzheimer's disease, unspecified: Secondary | ICD-10-CM

## 2019-03-18 LAB — BASIC METABOLIC PANEL
Anion gap: 7 (ref 5–15)
BUN: 38 mg/dL — ABNORMAL HIGH (ref 8–23)
CO2: 26 mmol/L (ref 22–32)
Calcium: 9.3 mg/dL (ref 8.9–10.3)
Chloride: 108 mmol/L (ref 98–111)
Creatinine, Ser: 1.43 mg/dL — ABNORMAL HIGH (ref 0.61–1.24)
GFR calc Af Amer: 48 mL/min — ABNORMAL LOW (ref >60)
GFR calc non Af Amer: 41 mL/min — ABNORMAL LOW (ref >60)
Glucose, Bld: 92 mg/dL (ref 70–99)
Potassium: 4.8 mmol/L (ref 3.5–5.1)
Sodium: 141 mmol/L (ref 135–145)

## 2019-03-18 MED ORDER — LORAZEPAM 2 MG/ML IJ SOLN
1.0000 mg | Freq: Once | INTRAMUSCULAR | Status: AC
  Administered 2019-03-18: 1 mg via INTRAVENOUS
  Filled 2019-03-18: qty 1

## 2019-03-18 MED ORDER — ACETAMINOPHEN 325 MG PO TABS: 650.0000 mg | ORAL_TABLET | Freq: Four times a day (QID) | ORAL | Status: AC | PRN

## 2019-03-18 MED ORDER — FLUCONAZOLE 100 MG PO TABS: 100.0000 mg | ORAL_TABLET | Freq: Every day | ORAL | 0 refills | Status: AC

## 2019-03-18 NOTE — Discharge Instructions (Signed)
STOP TAKING APIXABAN (ELIQUIS)  Dehydration, Adult  Dehydration is when there is not enough fluid or water in your body. This happens when you lose more fluids than you take in. Dehydration can range from mild to very bad. It should be treated right away to keep it from getting very bad. Symptoms of mild dehydration may include:  Thirst.  Dry lips.  Slightly dry mouth.  Dry, warm skin.  Dizziness. Symptoms of moderate dehydration may include:  Very dry mouth.  Muscle cramps.  Dark pee (urine). Pee may be the color of tea.  Your body making less pee.  Your eyes making fewer tears.  Heartbeat that is uneven or faster than normal (palpitations).  Headache.  Light-headedness, especially when you stand up from sitting.  Fainting (syncope). Symptoms of very bad dehydration may include:  Changes in skin, such as: ? Cold and clammy skin. ? Blotchy (mottled) or pale skin. ? Skin that does not quickly return to normal after being lightly pinched and let go (poor skin turgor).  Changes in body fluids, such as: ? Feeling very thirsty. ? Your eyes making fewer tears. ? Not sweating when body temperature is high, such as in hot weather. ? Your body making very little pee.  Changes in vital signs, such as: ? Weak pulse. ? Pulse that is more than 100 beats a minute when you are sitting still. ? Fast breathing. ? Low blood pressure.  Other changes, such as: ? Sunken eyes. ? Cold hands and feet. ? Confusion. ? Lack of energy (lethargy). ? Trouble waking up from sleep. ? Short-term weight loss. ? Unconsciousness. Follow these instructions at home:   If told by your doctor, drink an ORS: ? Make an ORS by using instructions on the package. ? Start by drinking small amounts, about  cup (120 mL) every 5-10 minutes. ? Slowly drink more until you have had the amount that your doctor said to have.  Drink enough clear fluid to keep your pee clear or pale yellow. If you  were told to drink an ORS, finish the ORS first, then start slowly drinking clear fluids. Drink fluids such as: ? Water. Do not drink only water by itself. Doing that can make the salt (sodium) level in your body get too low (hyponatremia). ? Ice chips. ? Fruit juice that you have added water to (diluted). ? Low-calorie sports drinks.  Avoid: ? Alcohol. ? Drinks that have a lot of sugar. These include high-calorie sports drinks, fruit juice that does not have water added, and soda. ? Caffeine. ? Foods that are greasy or have a lot of fat or sugar.  Take over-the-counter and prescription medicines only as told by your doctor.  Do not take salt tablets. Doing that can make the salt level in your body get too high (hypernatremia).  Eat foods that have minerals (electrolytes). Examples include bananas, oranges, potatoes, tomatoes, and spinach.  Keep all follow-up visits as told by your doctor. This is important. Contact a doctor if:  You have belly (abdominal) pain that: ? Gets worse. ? Stays in one area (localizes).  You have a rash.  You have a stiff neck.  You get angry or annoyed more easily than normal (irritability).  You are more sleepy than normal.  You have a harder time waking up than normal.  You feel: ? Weak. ? Dizzy. ? Very thirsty.  You have peed (urinated) only a small amount of very dark pee during 6-8 hours. Get help right  away if:  You have symptoms of very bad dehydration.  You cannot drink fluids without throwing up (vomiting).  Your symptoms get worse with treatment.  You have a fever.  You have a very bad headache.  You are throwing up or having watery poop (diarrhea) and it: ? Gets worse. ? Does not go away.  You have blood or something green (bile) in your throw-up.  You have blood in your poop (stool). This may cause poop to look black and tarry.  You have not peed in 6-8 hours.  You pass out (faint).  Your heart rate when you are  sitting still is more than 100 beats a minute.  You have trouble breathing. This information is not intended to replace advice given to you by your health care provider. Make sure you discuss any questions you have with your health care provider. Document Released: 05/06/2009 Document Revised: 06/22/2017 Document Reviewed: 09/03/2015 Elsevier Patient Education  2020 [place].   Acute Kidney Injury, Adult  Acute kidney injury is a sudden worsening of kidney function. The kidneys are organs that have several jobs. They filter the blood to remove waste products and extra fluid. They also maintain a healthy balance of minerals and hormones in the body, which helps control blood pressure and keep bones strong. With this condition, your kidneys do not do their jobs as well as they should. This condition ranges from mild to severe. Over time it may develop into long-lasting (chronic) kidney disease. Early detection and treatment may prevent acute kidney injury from developing into a chronic condition. What are the causes? Common causes of this condition include:  A problem with blood flow to the kidneys. This may be caused by: ? Low blood pressure (hypotension) or shock. ? Blood loss. ? Heart and blood vessel (cardiovascular) disease. ? Severe burns. ? Liver disease.  Direct damage to the kidneys. This may be caused by: ? Certain medicines. ? A kidney infection. ? Poisoning. ? Being around or in contact with toxic substances. ? A surgical wound. ? A hard, direct hit to the kidney area.  A sudden blockage of urine flow. This may be caused by: ? Cancer. ? Kidney stones. ? An enlarged prostate in males. What are the signs or symptoms? Symptoms of this condition may not be obvious until the condition becomes severe. Symptoms of this condition can include:  Tiredness (lethargy), or difficulty staying awake.  Nausea or vomiting.  Swelling (edema) of the face, legs, ankles, or  feet.  Problems with urination, such as: ? Abdominal pain, or pain along the side of your stomach (flank). ? Decreased urine production. ? Decrease in the force of urine flow.  Muscle twitches and cramps, especially in the legs.  Confusion or trouble concentrating.  Loss of appetite.  Fever. How is this diagnosed? This condition may be diagnosed with tests, including:  Blood tests.  Urine tests.  Imaging tests.  A test in which a sample of tissue is removed from the kidneys to be examined under a microscope (kidney biopsy). How is this treated? Treatment for this condition depends on the cause and how severe the condition is. In mild cases, treatment may not be needed. The kidneys may heal on their own. In more severe cases, treatment will involve:  Treating the cause of the kidney injury. This may involve changing any medicines you are taking or adjusting your dosage.  Fluids. You may need specialized IV fluids to balance your body's needs.  Having a  catheter placed to drain urine and prevent blockages.  Preventing problems from occurring. This may mean avoiding certain medicines or procedures that can cause further injury to the kidneys. In some cases treatment may also require:  A procedure to remove toxic wastes from the body (dialysis or continuous renal replacement therapy - CRRT).  Surgery. This may be done to repair a torn kidney, or to remove the blockage from the urinary system. Follow these instructions at home: Medicines  Take over-the-counter and prescription medicines only as told by your health care provider.  Do not take any new medicines without your health care provider's approval. Many medicines can worsen your kidney damage.  Do not take any vitamin and mineral supplements without your health care provider's approval. Many nutritional supplements can worsen your kidney damage. Lifestyle  If your health care provider prescribed changes to your diet,  follow them. You may need to decrease the amount of protein you eat.  Achieve and maintain a healthy weight. If you need help with this, ask your health care provider.  Start or continue an exercise plan. Try to exercise at least 30 minutes a day, 5 days a week.  Do not use any tobacco products, such as cigarettes, chewing tobacco, and e-cigarettes. If you need help quitting, ask your health care provider. General instructions  Keep track of your blood pressure. Report changes in your blood pressure as told by your health care provider.  Stay up to date with immunizations. Ask your health care provider which immunizations you need.  Keep all follow-up visits as told by your health care provider. This is important. Where to find more information  American Association of Kidney Patients: BombTimer.gl  National Kidney Foundation: www.kidney.[place]: https://mathis.com/  Life Options Rehabilitation Program: ? www.lifeoptions.org ? www.kidneyschool.org Contact a health care provider if:  Your symptoms get worse.  You develop new symptoms. Get help right away if:  You develop symptoms of worsening kidney disease, which include: ? Headaches. ? Abnormally dark or light skin. ? Easy bruising. ? Frequent hiccups. ? Chest pain. ? Shortness of breath. ? End of menstruation in women. ? Seizures. ? Confusion or altered mental status. ? Abdominal or back pain. ? Itchiness.  You have a fever.  Your body is producing less urine.  You have pain or bleeding when you urinate. Summary  Acute kidney injury is a sudden worsening of kidney function.  Acute kidney injury can be caused by problems with blood flow to the kidneys, direct damage to the kidneys, and sudden blockage of urine flow.  Symptoms of this condition may not be obvious until it becomes severe. Symptoms may include edema, lethargy, confusion, nausea or vomiting, and problems passing urine.  This  condition can usually be diagnosed with blood tests, urine tests, and imaging tests. Sometimes a kidney biopsy is done to diagnose this condition.  Treatment for this condition often involves treating the underlying cause. It is treated with fluids, medicines, dialysis, diet changes, or surgery. This information is not intended to replace advice given to you by your health care provider. Make sure you discuss any questions you have with your health care provider. Document Released: 01/23/2011 Document Revised: 06/22/2017 Document Reviewed: 06/30/2016 Elsevier Patient Education  2020 [place].   Dehydration  Dehydration is when there is not enough fluid or water in your body. This happens when you lose more fluids than you take in. People who are age 4 or older have a higher risk of  getting dehydrated. Dehydration can range from mild to very bad. It should be treated right away to keep it from getting very bad. Symptoms of mild dehydration may include:  Thirst.  Dry lips.  Slightly dry mouth.  Dry, warm skin.  Dizziness. Symptoms of moderate dehydration may include:  Very dry mouth.  Muscle cramps.  Dark pee (urine). Pee may be the color of tea.  Your body making less pee.  Your eyes making fewer tears.  Heartbeat that is uneven or faster than normal (palpitations).  Headache.  Light-headedness, especially when you stand up from sitting.  Fainting (syncope). Symptoms of very bad dehydration may include:  Changes in skin, such as: ? Cold and clammy skin. ? Blotchy (mottled) or pale skin. ? Skin that does not quickly return to normal after being lightly pinched and let go (poor skin turgor).  Changes in body fluids, such as: ? Feeling very thirsty. ? Your eyes making fewer tears. ? Not sweating when body temperature is high, such as in hot weather. ? Your body making very little pee.  Changes in vital signs, such as: ? Weak pulse. ? Pulse that is more than  100 beats a minute when you are sitting still. ? Fast breathing. ? Low blood pressure.  Other changes, such as: ? Sunken eyes. ? Cold hands and feet. ? Confusion. ? Lack of energy (lethargy). ? Trouble waking up from sleep. ? Short-term weight loss. ? Unconsciousness. Follow these instructions at home:   If told by your doctor, drink an ORS: ? Make an ORS by using instructions on the package. ? Start by drinking small amounts, about  cup (120 mL) every 5-10 minutes. ? Slowly drink more until you have had the amount that your doctor said to have.  Drink enough clear fluid to keep your pee clear or pale yellow. If you were told to drink an ORS, finish the ORS first, then start slowly drinking clear fluids. Drink fluids such as: ? Water. Do not drink only water by itself. Doing that can make the salt (sodium) level in your body get too low (hyponatremia). ? Ice chips. ? Fruit juice that you have added water to (diluted). ? Low-calorie sports drinks.  Avoid: ? Alcohol. ? Drinks that have a lot of sugar. These include high-calorie sports drinks, fruit juice that does not have water added, and soda. ? Caffeine. ? Foods that are greasy or have a lot of fat or sugar.  Take over-the-counter and prescription medicines only as told by your doctor.  Do not take salt tablets. Doing that can make the salt level in your body get too high (hypernatremia).  Eat foods that have minerals (electrolytes). Examples include bananas, oranges, potatoes, tomatoes, and spinach.  Keep all follow-up visits as told by your doctor. This is important. Contact a doctor if:  You have belly (abdominal) pain that: ? Gets worse. ? Stays in one area (localizes).  You have a rash.  You have a stiff neck.  You get angry or annoyed more easily than normal (irritability).  You are more sleepy than normal.  You have a harder time waking up than normal.  You feel: ? Weak. ? Dizzy. ? Very thirsty. Get  help right away if:  You have symptoms of very bad dehydration.  You cannot drink fluids without throwing up (vomiting).  Your symptoms get worse with treatment.  You have a fever.  You have a very bad headache.  You are throwing up or having  watery poop (diarrhea) and it: ? Gets worse. ? Does not go away.  You have diarrhea for more than 24 hours.  You have blood or something green (bile) in your throw-up.  You have blood in your poop (stool). This may cause poop to look black and tarry.  You have not peed in 6-8 hours.  You have peed (urinated) only a small amount of very dark pee during 6-8 hours.  You pass out (faint).  Your heart rate when you are sitting still is more than 100 beats a minute.  You have trouble breathing. This information is not intended to replace advice given to you by your health care provider. Make sure you discuss any questions you have with your health care provider. Document Released: 06/29/2011 Document Revised: 06/22/2017 Document Reviewed: 09/03/2015 Elsevier Patient Education  2020 [employer].

## 2019-03-18 NOTE — Discharge Summary (Signed)
Physician Discharge Summary  Roger Gardner [id number] DOB: 1922/06/29 DOA: 03/15/2019  PCP: Roger Chandler, NP Urology: [facility]  Admit date: 03/15/2019 Discharge date: 03/18/2019  Admitted From: Home  Disposition: Home   Recommendations for Outpatient Follow-up:  1. Follow up with PCP in 1 weeks 2. Follow up with [facility] first available 3. Please obtain BMP/CBC in 1-2 weeks  Discharge Condition: STABLE   CODE STATUS: DNR    Brief Hospitalization Summary: Please see all hospital notes, images, labs for full details of the hospitalization. 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 male, with history of dementia who lives at home with his wife and children's caregivers also has home health nurse assistance.  Patient has chronic Foley catheter placed due to urinary retention for past 3 months.  He usually gets Foley catheter replaced by home health agency.  For past few days patient was lethargic, urine culture was obtained and patient started on Bactrim.  But as per family patient started having hematuria 3 days ago and also was more lethargic.  Today the Foley catheter was replaced by the home health RN and advised to go to the ED for further evaluation.  Patient is on Eliquis for DVT in right lower extremity which was recently diagnosed. Patient has plain dementia so history obtained from patient's daughter at bedside. No history of nausea vomiting or diarrhea No history of abdominal pain No chest pain Patient also has been having difficulty swallowing due to lethargy as per daughter. No previous history of stroke or seizures.   Brief Admission Hx: 85 year old male with dementia living at home with family and caretakers presented with gross hematuria and ental status changes.  MDM/Assessment & Plan:   1. Acute renal failure- markedly elevated creatinine is improving with gentle hydration. Renal ultrasound with no hydronephrosis seen.   Creatinine  down to Renally dose medications. Recheck in AM. If continues to trend down to 1.4 from 5.25 on admission.  DC home.  2. Gross hematuria- resolved now. Was likely secondary to trauma associated with Foley catheter, apixaban with acute renal failure-Outpatient urology follow up.  Family decided to STOP Apixaban.   3. UTI-patient was treated with Bactrim double strength tablets prior to admission.  This has been discontinued.  Budding yeast noted on urine microscopy and has been started on fluconazole.  Urine culture no significant growth.  4. History of right lower extremity DVT and had been on apixaban for approximately 6 weeks.  Unfortunately patient continues to pull at foley and after discussion with daughter she says that family has decided to STOP APIXABAN and feel that the risks outweigh any potential benefits of the drug.  Placed TED hoses.  5. Senile dementia-daughter has been staying at bedside to assist patient with orientation and confusion and to help to redirect as necessary. 6. Leukocytosis-reactive-follow blood cultures and urine culture.  WBC trending down with treatments. No s/s of infection.  7. Hyperkalemia-Resolved.  Secondary to acute renal failure- treated with Kayexalate and improving.  No EKG findings of peaked T waves. 8. Dysphagia -daughter reports that patient does have difficulty at times with swallowing.  I have asked for an SLP evaluation who placed him on Dys 3 with nectar thickened liquids.  DVT prophylaxis: SCDs Code Status: DNR Family Communication: Daughter at bedside Disposition Plan: Home   Discharge Diagnoses:  Principal Problem:   Gross Hematuria Active Problems:   Dehydration   Yeast cystitis   Acute renal failure ([place])  Dementia ([place])   Hyperkalemia   Bilateral nephrolithiasis   Long term current use of anticoagulant therapy   Discharge Instructions:  Allergies as of 03/18/2019      Reactions   Penicillins Shortness Of Breath, Rash   Did it  involve swelling of the face/tongue/throat, SOB, or low BP? Unknown Did it involve sudden or severe rash/hives, skin peeling, or any reaction on the inside of your mouth or nose? Unknown Did you need to seek medical attention at a hospital or doctor's office? Unknown When did it last happen? 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   Codeine Other (See Comments)   Altered mental status      Medication List    STOP taking these medications   diphenhydrAMINE 50 MG tablet Commonly known as: BENADRYL   Eliquis 5 MG Tabs tablet Generic drug: apixaban   sulfamethoxazole-trimethoprim 800-160 MG tablet Commonly known as: BACTRIM DS   UNABLE TO FIND     TAKE these medications   acetaminophen 325 MG tablet Commonly known as: TYLENOL Take 2 tablets (650 mg total) by mouth every 6 (six) hours as needed for mild pain (or Fever >/= 101).   fluconazole 100 MG tablet Commonly known as: DIFLUCAN Take 1 tablet (100 mg total) by mouth at bedtime for 3 days.   loratadine 10 MG tablet Commonly known as: CLARITIN Take 10 mg by mouth as needed for allergies.            Durable Medical Equipment  (From admission, onward)         Start     Ordered   03/18/19 1038  For home use only DME lightweight manual wheelchair with seat cushion  Once    Comments: Patient suffers from  Renal failure, dehydration, weakness, Foley cath which impairs their ability to perform daily activities like walking in the home.  A walker will not resolve issue with performing activities of daily living. A wheelchair will allow patient to safely perform daily activities. Patient is not able to propel themselves in the home using a standard weight wheelchair due to weakness. Patient can self propel in the lightweight wheelchair. Length of need life  time. Accessories: seat and back cushion,elevating leg rests (ELRs), wheel locks, brake extensions and anti-tippers.   Length of need- Lifetime   03/18/19 1038         Follow-up Information    [facility].   Specialty: [place] Why: PT/ RN       [place] Follow up.   Why: Wheelchair       [place]. Go to.   Why: September 30th  Contact information: [place], [place] [address] [place]  SSN-[id number]         Allergies  Allergen Reactions  . Penicillins Shortness Of Breath and Rash    Did it involve swelling of the face/tongue/throat, SOB, or low BP? Unknown Did it involve sudden or severe rash/hives, skin peeling, or any reaction on the inside of your mouth or nose? Unknown Did you need to seek medical attention at a hospital or doctor's office? Unknown When did it last happen? 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   Marland Kitchen  Codeine Other (See Comments)    Altered mental status   Allergies as of 03/18/2019      Reactions   Penicillins Shortness Of Breath, Rash   Did it involve swelling of the face/tongue/throat, SOB, or low BP? Unknown Did it involve sudden or severe rash/hives, skin peeling, or any reaction on the inside of your mouth or nose? Unknown Did you need to seek medical attention at a hospital or doctor's office? Unknown When did it last happen? 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   Codeine Other (See Comments)   Altered mental status      Medication List    STOP taking these medications   diphenhydrAMINE 50 MG tablet Commonly known as: BENADRYL    Eliquis 5 MG Tabs tablet Generic drug: apixaban   sulfamethoxazole-trimethoprim 800-160 MG tablet Commonly known as: BACTRIM DS   UNABLE TO FIND     TAKE these medications   acetaminophen 325 MG tablet Commonly known as: TYLENOL Take 2 tablets (650 mg total) by mouth every 6 (six) hours as needed for mild pain (or Fever >/= 101).   fluconazole 100 MG tablet Commonly known as: DIFLUCAN Take 1 tablet (100 mg total) by mouth at bedtime for 3 days.   loratadine 10 MG tablet Commonly known as: CLARITIN Take 10 mg by mouth as needed for allergies.            Durable Medical Equipment  (From admission, onward)         Start     Ordered   03/18/19 1038  For home use only DME lightweight manual wheelchair with seat cushion  Once    Comments: Patient suffers from  Renal failure, dehydration, weakness, Foley cath which impairs their ability to perform daily activities like walking in the home.  A walker will not resolve issue with performing activities of daily living. A wheelchair will allow patient to safely perform daily activities. Patient is not able to propel themselves in the home using a standard weight wheelchair due to weakness. Patient can self propel in the lightweight wheelchair. Length of need life time. Accessories: seat and back cushion,elevating leg rests (ELRs), wheel locks, brake extensions and anti-tippers.   Length of need- Lifetime   03/18/19 1038          Procedures/Studies: Ct Head Wo Contrast  Result Date: 03/15/2019 CLINICAL DATA:  85 year old with dementia. Focal neuro deficit, > 6 hrs, stroke suspected EXAM: CT HEAD WITHOUT CONTRAST TECHNIQUE: Contiguous axial images were obtained from the base of the skull through the vertex without intravenous contrast. COMPARISON:  Remote head CT 12/27/2009 FINDINGS: Brain: No intracranial hemorrhage, mass effect, or midline shift. Generalized atrophy, progressed from 2011. Remote lacunar infarct in the left basal  ganglia and periventricular white matter. Advanced chronic small vessel ischemia. No hydrocephalus. The basilar cisterns are patent. No evidence of territorial infarct or acute ischemia. No extra-axial or intracranial fluid collection. Vascular: Atherosclerosis of skullbase vasculature without hyperdense vessel or abnormal calcification. Skull: No fracture or focal lesion. Sinuses/Orbits: Mild mucosal thickening of right maxillary sinus with tiny mucous retention cysts. No acute findings. Bilateral cataract resection. Other: None. IMPRESSION: 1. No acute intracranial abnormality. 2. Generalized atrophy and advanced chronic small vessel ischemia. Remote lacunar infarcts in the left basal ganglia and periventricular white matter. Electronically Signed   By: Aurther Loft.D.  On: 03/15/2019 20:39   US Renal  Result Date: 03/16/2019 CLINICAL DATA:  Acute kidney injury. EXAM: RENAL / URINARY TRACT ULTRASOUND COMPLETE COMPARISON:  03/15/2019 CT FINDINGS: Right Kidney: Renal measurements: 12.2 x 3.9 x 4.3 centimeters = volume: 107 mL. 5 millimeter calculus is identified in the LOWER pole region. A cyst in the LOWER pole is 9 millimeters. No hydronephrosis or solid renal mass. There is thinning of the renal parenchyma. Parenchyma appears echogenic. Left Kidney: Renal measurements: 11.5 x 6.5 x 4.5 centimeters = volume: 164 mL. Echogenicity within normal limits. No mass or hydronephrosis visualized. Bladder: Foley catheter decompresses the bladder. IMPRESSION: 1. No hydronephrosis. 2. RIGHT renal parenchyma appears slightly more echogenic and thinned compared to the LEFT kidney. 3. 5 millimeter calculus in the LOWER pole the RIGHT kidney. Benign RIGHT renal cyst. Electronically Signed   By: Nolon Nations M.D.   On: 03/16/2019 12:19   Dg Chest Portable 1 View  Result Date: 03/15/2019 CLINICAL DATA:  Altered mental status.  History of asbestosis EXAM: PORTABLE CHEST 1 VIEW COMPARISON:  06/16/2016 FINDINGS:  Mild cardiomegaly. No confluent opacities, effusions or edema. No acute bony abnormality. IMPRESSION: Mild cardiomegaly.  No active disease. Electronically Signed   By: Rolm Baptise M.D.   On: 03/15/2019 18:18   Ct Renal Stone Study  Result Date: 03/15/2019 CLINICAL DATA:  Hematuria EXAM: CT ABDOMEN AND PELVIS WITHOUT CONTRAST TECHNIQUE: Multidetector CT imaging of the abdomen and pelvis was performed following the standard protocol without IV contrast. COMPARISON:  None. FINDINGS: Lower chest: Bibasilar atelectasis. Heart is mildly enlarged. Moderate-sized hiatal hernia. Hepatobiliary: No focal hepatic abnormality. Gallbladder unremarkable. Pancreas: No focal abnormality or ductal dilatation. Spleen: No focal abnormality.  Normal size. Adrenals/Urinary Tract: Bilateral nephrolithiasis with largest stone in the right midpole measuring 6 mm. Mild fullness of the renal collecting systems bilaterally. No ureteral stones. Urinary bladder is decompressed with Foley catheter in place. Bladder wall is difficult to evaluate. Adrenal glands unremarkable. Stomach/Bowel: Sigmoid diverticulosis. No active diverticulitis. Stomach and small bowel decompressed, grossly unremarkable. Vascular/Lymphatic: Aortic atherosclerosis. No enlarged abdominal or pelvic lymph nodes. Reproductive: Prostate enlargement. Other: No free fluid or free air. Musculoskeletal: No acute bony abnormality. IMPRESSION: There is mild fullness of the renal collecting system and ureters bilaterally without ureteral stone. Bilateral nephrolithiasis. Sigmoid diverticulosis. Aortic atherosclerosis. Prostate enlargement. Electronically Signed   By: Rolm Baptise M.D.   On: 03/15/2019 22:52     Subjective: Pt more vocal and moving more and eating much better.  He is drinking. He is asking about home per daughter.   Discharge Exam: Vitals:   03/17/19 2201 03/18/19 0633  BP: (!) 149/75 140/77  Pulse: 85 82  Resp: 18 19  Temp: 99.1 F (37.3 C) (!)  97.5 F (36.4 C)  SpO2: 96% 97%   Vitals:   03/17/19 1359 03/17/19 1953 03/17/19 2201 03/18/19 0633  BP: (!) 143/83  (!) 149/75 140/77  Pulse: 85  85 82  Resp: 18  18 19   Temp: 98.2 F (36.8 C)  99.1 F (37.3 C) (!) 97.5 F (36.4 C)  TempSrc:   Oral Axillary  SpO2: 96% 96% 96% 97%  Weight:      Height:       General exam: Elderly male with dementia lying in bed resting comfortably in no distress. Respiratory system: Bilateral breath sounds.  No increased work of breathing. Cardiovascular system: S1 & S2 heard. No JVD, murmurs, gallops, clicks or pedal edema. Gastrointestinal system: Abdomen is nondistended, soft and  nontender. Normal bowel sounds heard. GU: Foley with gross hematuria seen. Central nervous system: With dementia. No focal neurological deficits. Extremities: no CCE.   The results of significant diagnostics from this hospitalization (including imaging, microbiology, ancillary and laboratory) are listed below for reference.     Microbiology: Recent Results (from the past 240 hour(s))  Blood culture (routine x 2)     Status: None (Preliminary result)   Collection Time: 03/15/19  7:25 PM   Specimen: BLOOD RIGHT WRIST  Result Value Ref Range Status   Specimen Description BLOOD RIGHT WRIST  Final   Special Requests   Final    BOTTLES DRAWN AEROBIC AND ANAEROBIC Blood Culture adequate volume   Culture   Final    NO GROWTH 3 DAYS Performed at [facility], [address], [place], [place] [address]    Report Status PENDING  Incomplete  Blood culture (routine x 2)     Status: None (Preliminary result)   Collection Time: 03/15/19  7:41 PM   Specimen: BLOOD LEFT ARM  Result Value Ref Range Status   Specimen Description BLOOD LEFT ARM  Final   Special Requests   Final    BOTTLES DRAWN AEROBIC ONLY Blood Culture adequate volume   Culture   Final    NO GROWTH 3 DAYS Performed at [facility], [address] [place], [place] [address]    Report Status PENDING   Incomplete  Urine Culture     Status: None   Collection Time: 03/15/19  8:01 PM   Specimen: Urine, Catheterized  Result Value Ref Range Status   Specimen Description   Final    URINE, CATHETERIZED Performed at [facility], [address] [place], [place] [address]    Special Requests   Final    NONE Performed at [facility], [address] [place], [place] [address]    Culture   Final    Multiple bacterial morphotypes present, none predominant. Suggest appropriate recollection if clinically indicated.   Report Status 03/17/2019 FINAL  Final  SARS Coronavirus 2 [facility] order, Performed in [facility] hospital lab) Nasopharyngeal Nasopharyngeal Swab     Status: None   Collection Time: 03/15/19  8:30 PM   Specimen: Nasopharyngeal Swab  Result Value Ref Range Status   SARS Coronavirus 2 NEGATIVE NEGATIVE Final    Comment: (NOTE) If result is NEGATIVE SARS-CoV-2 target nucleic acids are NOT DETECTED. The SARS-CoV-2 RNA is generally detectable in upper and lower  respiratory specimens during the acute phase of infection. The lowest  concentration of SARS-CoV-2 viral copies this assay can detect is 250  copies / mL. A negative result does not preclude SARS-CoV-2 infection  and should not be used as the sole basis for treatment or other  patient management decisions.  A negative result may occur with  improper specimen collection / handling, submission of specimen other  than nasopharyngeal swab, presence of viral mutation(s) within the  areas targeted by this assay, and inadequate number of viral copies  (<250 copies / mL). A negative result must be combined with clinical  observations, patient history, and epidemiological information. If result is POSITIVE SARS-CoV-2 target nucleic acids are DETECTED. The SARS-CoV-2 RNA is generally detectable in upper and lower  respiratory specimens dur ing the acute phase of infection.  Positive  results are indicative of active infection  with SARS-CoV-2.  Clinical  correlation with patient history and other diagnostic information is  necessary to determine patient infection status.  Positive results do  not rule out bacterial infection or co-infection with other viruses. If result is PRESUMPTIVE POSTIVE SARS-CoV-2 nucleic acids MAY BE PRESENT.   A presumptive positive result was obtained on the submitted specimen  and confirmed on repeat testing.  While 2019 novel coronavirus  (SARS-CoV-2) nucleic acids may be present in the submitted sample  additional confirmatory testing may be necessary for epidemiological  and / or clinical management purposes  to differentiate between  SARS-CoV-2 and other Sarbecovirus currently known to infect humans.  If clinically indicated additional testing with an alternate test  methodology [phone]) is advised. The SARS-CoV-2 RNA is generally  detectable in upper and lower respiratory sp ecimens during the acute  phase of infection. The expected result is Negative. Fact Sheet for Patients:  StrictlyIdeas.no Fact Sheet for Healthcare Providers: BankingDealers.co.za This test is not yet approved or cleared by the [place] FDA and has been authorized for detection and/or diagnosis of SARS-CoV-2 by FDA under an Emergency Use Authorization (EUA).  This EUA will remain in effect (meaning this test can be used) for the duration of the COVID-19 declaration under Section 564(b)(1) of the Act, 21 U.S.C. section 360bbb-3(b)(1), unless the authorization is terminated or revoked sooner. Performed at [facility], [address], [place], [place] [address]      Labs: BNP (last 3 results) No results for input(s): BNP in the last 8760 hours. Basic Metabolic Panel: Recent Labs  Lab 03/15/19 1647 03/16/19 0604 03/17/19 0422 03/17/19 0423 03/18/19 0707  NA 139 145  --  146* 141  K 5.9* 5.0  --  4.5 4.8  CL 104 112*  --  115* 108  CO2 22 24  --   23 26  GLUCOSE 130* 86  --  78 92  BUN 84* 74*  --  61* 38*  CREATININE 5.25* 3.57*  --  2.16* 1.43*  CALCIUM 9.5 8.9  --  8.9 9.3  MG  --   --  2.3  --   --   PHOS  --   --   --  3.6  --    Liver Function Tests: Recent Labs  Lab 03/15/19 1647 03/16/19 0604 03/17/19 0423  AST 30 24  --   ALT 21 19  --   ALKPHOS 76 65  --   BILITOT 0.7 0.6  --   PROT 7.4 6.6  --   ALBUMIN 3.1* 2.6* 2.6*   No results for input(s): LIPASE, AMYLASE in the last 168 hours. No results for input(s): AMMONIA in the last 168 hours. CBC: Recent Labs  Lab 03/15/19 1647 03/16/19 0604 03/17/19 0422  WBC 25.2* 18.9* 13.6*  NEUTROABS 21.5*  --   --   HGB 12.8* 11.1* 10.9*  HCT 41.3 36.5* 36.8*  MCV 86.6 88.8 90.6  PLT 443* 419* 428*   Cardiac Enzymes: No results for input(s): CKTOTAL, CKMB, CKMBINDEX, TROPONINI in the last 168 hours. BNP: Invalid input(s): POCBNP CBG: No results for input(s): GLUCAP in the last 168 hours. D-Dimer No results for input(s): DDIMER in the last 72 hours. Hgb A1c No results for input(s): HGBA1C in the last 72 hours. Lipid Profile No results for input(s): CHOL, HDL, LDLCALC, TRIG, CHOLHDL, LDLDIRECT in the last 72 hours. Thyroid function studies No results for input(s): TSH, T4TOTAL, T3FREE, THYROIDAB in the last 72 hours.  Invalid input(s): FREET3 Anemia work up No results for input(s): VITAMINB12, FOLATE, FERRITIN, TIBC, IRON, RETICCTPCT in the last 72 hours. Urinalysis    Component Value Date/Time  COLORURINE RED (A) 03/15/2019 1820   APPEARANCEUR CLOUDY (A) 03/15/2019 1820   LABSPEC 1.018 03/15/2019 1820   PHURINE 9.0 (H) 03/15/2019 1820   GLUCOSEU NEGATIVE 03/15/2019 1820   HGBUR LARGE (A) 03/15/2019 1820   BILIRUBINUR NEGATIVE 03/15/2019 1820   BILIRUBINUR Negative 12/27/2018 1200   KETONESUR NEGATIVE 03/15/2019 1820   PROTEINUR >=300 (A) 03/15/2019 1820   UROBILINOGEN 0.2 12/27/2018 1200   NITRITE NEGATIVE 03/15/2019 1820   LEUKOCYTESUR MODERATE  (A) 03/15/2019 1820   Sepsis Labs Invalid input(s): PROCALCITONIN,  WBC,  LACTICIDVEN Microbiology Recent Results (from the past 240 hour(s))  Blood culture (routine x 2)     Status: None (Preliminary result)   Collection Time: 03/15/19  7:25 PM   Specimen: BLOOD RIGHT WRIST  Result Value Ref Range Status   Specimen Description BLOOD RIGHT WRIST  Final   Special Requests   Final    BOTTLES DRAWN AEROBIC AND ANAEROBIC Blood Culture adequate volume   Culture   Final    NO GROWTH 3 DAYS Performed at [facility], [address] [place], [place] [address]    Report Status PENDING  Incomplete  Blood culture (routine x 2)     Status: None (Preliminary result)   Collection Time: 03/15/19  7:41 PM   Specimen: BLOOD LEFT ARM  Result Value Ref Range Status   Specimen Description BLOOD LEFT ARM  Final   Special Requests   Final    BOTTLES DRAWN AEROBIC ONLY Blood Culture adequate volume   Culture   Final    NO GROWTH 3 DAYS Performed at [facility], [address], [place], [place] [address]    Report Status PENDING  Incomplete  Urine Culture     Status: None   Collection Time: 03/15/19  8:01 PM   Specimen: Urine, Catheterized  Result Value Ref Range Status   Specimen Description   Final    URINE, CATHETERIZED Performed at [facility], [address] [place], [place] [address]    Special Requests   Final    NONE Performed at [facility], [address] [place], [place] [address]    Culture   Final    Multiple bacterial morphotypes present, none predominant. Suggest appropriate recollection if clinically indicated.   Report Status 03/17/2019 FINAL  Final  SARS Coronavirus 2 [facility] order, Performed in [facility] hospital lab) Nasopharyngeal Nasopharyngeal Swab     Status: None   Collection Time: 03/15/19  8:30 PM   Specimen: Nasopharyngeal Swab  Result Value Ref Range Status   SARS Coronavirus 2 NEGATIVE NEGATIVE Final    Comment: (NOTE) If result is NEGATIVE SARS-CoV-2  target nucleic acids are NOT DETECTED. The SARS-CoV-2 RNA is generally detectable in upper and lower  respiratory specimens during the acute phase of infection. The lowest  concentration of SARS-CoV-2 viral copies this assay can detect is 250  copies / mL. A negative result does not preclude SARS-CoV-2 infection  and should not be used as the sole basis for treatment or other  patient management decisions.  A negative result may occur with  improper specimen collection / handling, submission of specimen other  than nasopharyngeal swab, presence of viral mutation(s) within the  areas targeted by this assay, and inadequate number of viral copies  (<250 copies / mL). A negative result must be combined with clinical  observations, patient history, and epidemiological information. If result is POSITIVE SARS-CoV-2 target nucleic acids are DETECTED. The SARS-CoV-2 RNA is generally detectable in upper and lower  respiratory specimens dur ing the acute phase of infection.  Positive  results are indicative of active infection with SARS-CoV-2.  Clinical  correlation with patient history and other diagnostic information is  necessary to determine patient infection status.  Positive results do  not rule out bacterial infection or co-infection with other viruses. If result is PRESUMPTIVE POSTIVE SARS-CoV-2 nucleic acids MAY BE PRESENT.   A presumptive positive result was obtained on the submitted specimen  and confirmed on repeat testing.  While 2019 novel coronavirus  (SARS-CoV-2) nucleic acids may be present in the submitted sample  additional confirmatory testing may be necessary for epidemiological  and / or clinical management purposes  to differentiate between  SARS-CoV-2 and other Sarbecovirus currently known to infect humans.  If clinically indicated additional testing with an alternate test  methodology [phone]) is advised. The SARS-CoV-2 RNA is generally  detectable in upper and lower  respiratory sp ecimens during the acute  phase of infection. The expected result is Negative. Fact Sheet for Patients:  StrictlyIdeas.no Fact Sheet for Healthcare Providers: BankingDealers.co.za This test is not yet approved or cleared by the [place] FDA and has been authorized for detection and/or diagnosis of SARS-CoV-2 by FDA under an Emergency Use Authorization (EUA).  This EUA will remain in effect (meaning this test can be used) for the duration of the COVID-19 declaration under Section 564(b)(1) of the Act, 21 U.S.C. section 360bbb-3(b)(1), unless the authorization is terminated or revoked sooner. Performed at [facility], [address], [place], [place] [address]    Time coordinating discharge: 36 mins  SIGNED:  Irwin Brakeman, MD  [facility] 03/18/2019, 12:08 PM How to contact the [facility] Attending or Consulting provider [place] or covering provider during after hours [place], for this patient?  1. Check the care team in [facility] and look for a) attending/consulting TRH provider listed and b) the [facility] team listed 2. Log into [email or web address] and use [place]'s universal password to access. If you do not have the password, please contact the hospital operator. 3. Locate the [facility] provider you are looking for under [facility] and page to a number that you can be directly reached. 4. If you still have difficulty reaching the provider, please page the [facility] (Director on Call) for the Hospitalists listed on amion for assistance.

## 2019-03-18 NOTE — Telephone Encounter (Signed)
Transition Care Management Follow-up Telephone Call  Date of discharge and from where: 03/18/2019 [place]  How have you been since you were released from the hospital? Daughter states patient is better but was very sick.   Any questions or concerns? Yes  Patient is keeping wife up at night.   Items Reviewed:  Did the pt receive and understand the discharge instructions provided? Yes   Medications obtained and verified? Yes   Any new allergies since your discharge? No   Dietary orders reviewed? Yes  Do you have support at home? Yes  daughters and wife  Other (ie: DME, Home Health, etc) Home Health  Functional Questionnaire: (I = Independent and D = Dependent) ADL's: D Wheelchair  Bathing/Dressing- D   Meal Prep- D  Eating- I with assistance  Maintaining continence- D Foley Catheter  Transferring/Ambulation- D Wheelchair  Managing Meds- D    Follow up appointments reviewed:    PCP Hospital f/u appt confirmed? Yes  Scheduled to see Dinah on 03/19/19 .  [facility] f/u appt confirmed? Yes  Family Scheduling to see [facility]  Are transportation arrangements needed? No   If their condition worsens, is the pt aware to call  their PCP or go to the ED? Yes  Was the patient provided with contact information for the PCP's office or ED? Yes  Was the pt encouraged to call back with questions or concerns? Yes

## 2019-03-18 NOTE — TOC Transition Note (Signed)
Transition of Care [facility]) - CM/SW Discharge Note   Patient Details  Name: Roger Gardner MRN: [id number] Date of Birth: 1921/10/08  Transition of [facility] CM/SW Contact:  Boneta Lucks, RN Phone Number: 03/18/2019, 10:56 AM   Clinical Narrative:   Patient is discharging today with [facility]. Vaughan Basta is aware. Blake Divine is delivering Wheel Chair and RN is aware patient will need EMS for transport home. [name] - daughter wanted her father to have Follow up with [facility] in [place], the [place] office to to far with his age and Foley care. The office agreed to switch him and appointment was made for Sept 30th,  Faxing office notes to triage nurse in case they need to see patient sooner. Added appointment  to AVS and update daughter.   Final next level of care: [facility]     Patient Goals and CMS Choice Patient states their goals for this hospitalization and ongoing recovery are:: to go back home. CMS Medicare.gov Compare Post Acute Care list provided to:: Patient Represenative (must comment)(debra - daughter) Choice offered to / list presented to : Adult Children  Discharge Placement                  Name of family member notified: [name] - daughter Patient and family notified of of transfer: 03/18/19  Discharge Plan and Services   Discharge Planning Services: CM Consult Post Acute Care Choice: Home Health, Durable Medical Equipment          DME Arranged: Wheelchair manual DME Agency: [employer] Date DME Agency Contacted: 03/17/19 Time DME Agency Contacted: [id number] Representative spoke with at DME Agency: [place]: PT, RN [facility] Agency: [place] ([place]) Date [place]: 03/17/19 Time [place]: 49 Representative spoke with at [place]: Romualdo Bolk       Readmission Risk Interventions No flowsheet data found.

## 2019-03-19 ENCOUNTER — Other Ambulatory Visit: Payer: Self-pay

## 2019-03-19 ENCOUNTER — Encounter: Payer: Self-pay | Admitting: Family

## 2019-03-19 ENCOUNTER — Ambulatory Visit (INDEPENDENT_AMBULATORY_CARE_PROVIDER_SITE_OTHER): Payer: Medicare Other | Admitting: Family

## 2019-03-19 DIAGNOSIS — Z960 Presence of urogenital implants: Secondary | ICD-10-CM

## 2019-03-19 DIAGNOSIS — D72829 Elevated white blood cell count, unspecified: Secondary | ICD-10-CM | POA: Diagnosis not present

## 2019-03-19 DIAGNOSIS — N179 Acute kidney failure, unspecified: Secondary | ICD-10-CM | POA: Diagnosis not present

## 2019-03-19 DIAGNOSIS — Z978 Presence of other specified devices: Secondary | ICD-10-CM

## 2019-03-19 DIAGNOSIS — E875 Hyperkalemia: Secondary | ICD-10-CM

## 2019-03-19 DIAGNOSIS — Z86718 Personal history of other venous thrombosis and embolism: Secondary | ICD-10-CM | POA: Diagnosis not present

## 2019-03-19 DIAGNOSIS — R531 Weakness: Secondary | ICD-10-CM

## 2019-03-19 DIAGNOSIS — R1311 Dysphagia, oral phase: Secondary | ICD-10-CM | POA: Diagnosis not present

## 2019-03-19 NOTE — Progress Notes (Signed)
This service is provided via telemedicine  No vital signs collected/recorded due to the encounter was a telemedicine visit.   Location of patient (ex: home, work):  Home   Patient consents to a telephone visit:  Yes  Location of the provider (ex: office, home): Office   Name of any referring provider:  Sherrie Mustache, NP   Names of all persons participating in the telemedicine service and their role in the encounter:  Roger Ngetich NP, Ruthell Rummage CMA, Lacey Jensen and [name]   Time spent on call:  Ruthell Rummage CMA, spent 10 minutes on phone with patient    Provider: Dinah Ngetich FNP-C  [name], NP  Patient Care Team: [name], NP as PCP - General (Geriatric Medicine)  Extended Emergency Contact Information Primary Emergency Contact: Roger Gardner Mobile Phone: [phone] Relation: Daughter Secondary Emergency Contact: Gardner,Roger Address: [place],  [place] Home Phone: [phone] Relation: None  Code Status: DNR Goals of care: Advanced Directive information Advanced Directives 03/16/2019  Does Patient Have a Medical Advance Directive? Unable to assess, patient is non-responsive or altered mental status  Type of Advance Directive -  Does patient want to make changes to medical advance directive? -  Copy of [place] in Chart? -  Would patient like information on creating a medical advance directive? No - Guardian declined     Chief Complaint  Patient presents with  . Transitions Of Care    Hospitalization for Acute Renal Failure 03/15/2019 - 03/18/2019     HPI:  Pt is a 85 y.o. male seen today for transition of care post hospital admission 03/15/2019 -03/18/2019 for hematuria, lethargy and mental status changes.He has a significant medical history of Dementia.His lab work results showed  CR 5.25 on admission but with hydration trended down to 1.4 on discharge.Renal ultrasound 03/16/2019 showed  no mass or hydronephrosis noted.  CT scan of the abdomen and pelvis w/o contrast  03/15/2019 showed bilateral nephrolithiasis,sigmoid diverticulosis,aortic atherosclerosis and prostate enlargement.He also had a CT scan of the head w/o contrast which showed no intracranial hemorrhage or mass.generalized atrophy,advanced chronic small vessel ischemia which has progressed compared to 2011 scan.Also noted remote lacunar infarcts in the left basal ganglia and periventricular white matter. His chest X-ray showed mild cardiomegaly but no active disease noted. His urine analysis showed red,cloudy,nitrite negative,moderate leukocytes,>300 protein,large hgb,WBC>50 with rare bacteria.He was on bactrim DS on admission which was discontinued budding yeast noted on urine microscopy.He was treated with fluconazole. CBC had WBC 25.2 ,blood cultures was negative for growth.SARS coronavirus 2 was negative.     Family decided to discontinue Apixaban due to hematuria.patient constantly pulls on foley catheter.He has been on Apixaban approximately for 6 weeks for right lower extremity DVT.   Also had Hyperkalemia 5.9 on admission thought due to acute renal failure.treated with kayexalate with much improvement.K+ down to 4.8 on discharge.EKG showed peaked T waves.   He was also seen by speech Therapy due to dysphagia.He was placed on dysphagia 3 diet with nectar thickened liquids.  He was discharged with Home health Nurse,physical and speech therapy. Per wife family requested change of [place] agency from [facility] to [facility] due to previous Nurse not changing foley catheter as directed by PCP.    Medication reviewed.    Past Medical History:  Diagnosis Date  . 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 Dementia ([place])   . Flu  1999   Hospitalized by Dr. Lenord Fellers IM   Past Surgical History:  Procedure Laterality Date  . HERNIA REPAIR    . KIDNEY STONE SURGERY     Basket extraction by Dr. Michela Pitcher      Allergies  Allergen Reactions  . Penicillins Shortness Of Breath and Rash    Did it involve swelling of the face/tongue/throat, SOB, or low BP? Unknown Did it involve sudden or severe rash/hives, skin peeling, or any reaction on the inside of your mouth or nose? Unknown Did you need to seek medical attention at a hospital or doctor's office? Unknown When did it last happen? 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   . Codeine Other (See Comments)    Altered mental status    Outpatient Encounter Medications as of 03/19/2019  Medication Sig  . acetaminophen (TYLENOL) 325 MG tablet Take 2 tablets (650 mg total) by mouth every 6 (six) hours as needed for mild pain (or Fever >/= 101).  . fluconazole (DIFLUCAN) 100 MG tablet Take 1 tablet (100 mg total) by mouth at bedtime for 3 days.  Marland Kitchen loratadine (CLARITIN) 10 MG tablet Take 10 mg by mouth as needed for allergies.   No facility-administered encounter medications on file as of 03/19/2019.     Review of Systems  Unable to perform ROS: Dementia (information provided by patient's wife Roger Gardner.)  Constitutional: Negative for chills and fever.  HENT: Positive for trouble swallowing. Negative for congestion, rhinorrhea, sinus pressure, sinus pain, sneezing and sore throat.   Eyes: Negative for discharge and redness.  Respiratory: Negative for cough, chest tightness, shortness of breath and wheezing.   Cardiovascular: Negative for chest pain, palpitations and leg swelling.  Gastrointestinal: Negative for abdominal distention, abdominal pain, constipation, diarrhea, nausea and vomiting.  Endocrine: Negative for cold intolerance, heat intolerance, polydipsia, polyphagia and polyuria.  Genitourinary:       Indwelling foley catheter still draining bloody urine though has improved per patient's  wife.  Musculoskeletal: Positive for gait problem.  Skin: Negative for color change, pallor and rash.  Neurological: Negative for dizziness, light-headedness and headaches.       Generalized weakness   Psychiatric/Behavioral: Positive for confusion. Negative for agitation. The patient is not nervous/anxious.        Was restless last night.      There is no immunization history on file for this patient. Pertinent  Health Maintenance Due  Topic Date Due  . INFLUENZA VACCINE  02/22/2019  . PNA vac Low Risk Adult (1 of 2 - PCV13) 09/27/2019 (Originally 10/08/1986)   Fall Risk  03/19/2019 03/14/2019 03/06/2019 03/04/2019 01/22/2019  Falls in the past year? 0 1 0 1 1  Number falls in past yr: 0 0 0 1 1  Injury with Fall? 0 0 0 0 0  Risk for fall due to : - - - - -   There were no vitals filed for this visit. There is no height or weight on file to calculate BMI. Physical Exam Unable to complete on telephone visit.   Labs reviewed: Recent Labs    03/16/19 0604 03/17/19 0422 03/17/19 0423 03/18/19 0707  NA 145  --  146* 141  K 5.0  --  4.5 4.8  CL 112*  --  115* 108  CO2 24  --  23 26  GLUCOSE 86  --  78 92  BUN 74*  --  61* 38*  CREATININE 3.57*  --  2.16* 1.43*  CALCIUM 8.9  --  8.9 9.3  MG  --  2.3  --   --   PHOS  --   --  3.6  --    Recent Labs    11/11/18 1714 03/15/19 1647 03/16/19 0604 03/17/19 0423  AST 22 30 24   --   ALT 23 21 19   --   ALKPHOS 64 76 65  --   BILITOT 0.5 0.7 0.6  --   PROT 6.9 7.4 6.6  --   ALBUMIN 3.1* 3.1* 2.6* 2.6*   Recent Labs    12/27/18 1159 02/04/19 1016 03/15/19 1647 03/16/19 0604 03/17/19 0422  WBC 10.8 13.0* 25.2* 18.9* 13.6*  NEUTROABS 6,275 8,047* 21.5*  --   --   HGB 12.8* 12.7* 12.8* 11.1* 10.9*  HCT 39.7 39.7 41.3 36.5* 36.8*  MCV 86.7 86.5 86.6 88.8 90.6  PLT 267 258 443* 419* 428*   No results found for: TSH No results found for: HGBA1C No results found for: CHOL, HDL, LDLCALC, LDLDIRECT, TRIG,  CHOLHDL  Significant Diagnostic Results in last 30 days:  Ct Head Wo Contrast  Result Date: 03/15/2019 CLINICAL DATA:  85 year old with dementia. Focal neuro deficit, > 6 hrs, stroke suspected EXAM: CT HEAD WITHOUT CONTRAST TECHNIQUE: Contiguous axial images were obtained from the base of the skull through the vertex without intravenous contrast. COMPARISON:  Remote head CT 12/27/2009 FINDINGS: Brain: No intracranial hemorrhage, mass effect, or midline shift. Generalized atrophy, progressed from 2011. Remote lacunar infarct in the left basal ganglia and periventricular white matter. Advanced chronic small vessel ischemia. No hydrocephalus. The basilar cisterns are patent. No evidence of territorial infarct or acute ischemia. No extra-axial or intracranial fluid collection. Vascular: Atherosclerosis of skullbase vasculature without hyperdense vessel or abnormal calcification. Skull: No fracture or focal lesion. Sinuses/Orbits: Mild mucosal thickening of right maxillary sinus with tiny mucous retention cysts. No acute findings. Bilateral cataract resection. Other: None. IMPRESSION: 1. No acute intracranial abnormality. 2. Generalized atrophy and advanced chronic small vessel ischemia. Remote lacunar infarcts in the left basal ganglia and periventricular white matter. Electronically Signed   By: Keith Rake M.D.   On: 03/15/2019 20:39   US Renal  Result Date: 03/16/2019 CLINICAL DATA:  Acute kidney injury. EXAM: RENAL / URINARY TRACT ULTRASOUND COMPLETE COMPARISON:  03/15/2019 CT FINDINGS: Right Kidney: Renal measurements: 12.2 x 3.9 x 4.3 centimeters = volume: 107 mL. 5 millimeter calculus is identified in the LOWER pole region. A cyst in the LOWER pole is 9 millimeters. No hydronephrosis or solid renal mass. There is thinning of the renal parenchyma. Parenchyma appears echogenic. Left Kidney: Renal measurements: 11.5 x 6.5 x 4.5 centimeters = volume: 164 mL. Echogenicity within normal limits. No mass or  hydronephrosis visualized. Bladder: Foley catheter decompresses the bladder. IMPRESSION: 1. No hydronephrosis. 2. RIGHT renal parenchyma appears slightly more echogenic and thinned compared to the LEFT kidney. 3. 5 millimeter calculus in the LOWER pole the RIGHT kidney. Benign RIGHT renal cyst. Electronically Signed   By: Nolon Nations M.D.   On: 03/16/2019 12:19   Dg Chest Portable 1 View  Result Date: 03/15/2019 CLINICAL DATA:  Altered mental status.  History of asbestosis EXAM: PORTABLE CHEST 1 VIEW COMPARISON:  06/16/2016 FINDINGS: Mild cardiomegaly. No confluent opacities, effusions or edema. No acute bony abnormality. IMPRESSION: Mild cardiomegaly.  No active disease. Electronically Signed   By: Rolm Baptise M.D.  On: 03/15/2019 18:18   Ct Renal Stone Study  Result Date: 03/15/2019 CLINICAL DATA:  Hematuria EXAM: CT ABDOMEN AND PELVIS WITHOUT CONTRAST TECHNIQUE: Multidetector CT imaging of the abdomen and pelvis was performed following the standard protocol without IV contrast. COMPARISON:  None. FINDINGS: Lower chest: Bibasilar atelectasis. Heart is mildly enlarged. Moderate-sized hiatal hernia. Hepatobiliary: No focal hepatic abnormality. Gallbladder unremarkable. Pancreas: No focal abnormality or ductal dilatation. Spleen: No focal abnormality.  Normal size. Adrenals/Urinary Tract: Bilateral nephrolithiasis with largest stone in the right midpole measuring 6 mm. Mild fullness of the renal collecting systems bilaterally. No ureteral stones. Urinary bladder is decompressed with Foley catheter in place. Bladder wall is difficult to evaluate. Adrenal glands unremarkable. Stomach/Bowel: Sigmoid diverticulosis. No active diverticulitis. Stomach and small bowel decompressed, grossly unremarkable. Vascular/Lymphatic: Aortic atherosclerosis. No enlarged abdominal or pelvic lymph nodes. Reproductive: Prostate enlargement. Other: No free fluid or free air. Musculoskeletal: No acute bony abnormality.  IMPRESSION: There is mild fullness of the renal collecting system and ureters bilaterally without ureteral stone. Bilateral nephrolithiasis. Sigmoid diverticulosis. Aortic atherosclerosis. Prostate enlargement. Electronically Signed   By: Rolm Baptise M.D.   On: 03/15/2019 22:52    Assessment/Plan 1. Generalized weakness Requires assistance with his ADL's family assist.Has Home health Physical therapy will be coming in today per wife.  2. Hyperkalemia K+ 5.9 > 4.8 on discharge. Will recheck BMP in 1 week.   3. Leukocytosis, unspecified type WBC 25.2 >18.9 > 13.6  Will recheck CBC/diff in 1 week.   4. Oral phase dysphagia Continue on Dysphagia 3 diet with nectar Thick liquids. Speech Therapy as directed from the hospital.   5. History of DVT (deep vein thrombosis) Apixaban discontinued during hospital admission due to hematuria and patient constant pulling of foley catheter.Family requested Apixaban stopped.   6. Chronic indwelling Foley catheter Hematuria has improved per wife.Bactrim DS on admission was discontinued budding yeast noted on urine microscopy.continue on  fluconazole.  7. Acute renal failure, unspecified acute renal failure type ([place]) CR 5.25 on admission > 3.57 > 2.16 > 1.43 on discharge. Will recheck BMP in 1 week.   Family/ staff Communication: Reviewed plan of care with patient and wife.  Labs/tests ordered: Home health Nurse to draw CBC/diff BMP in 1 week.  Labs order faxed to [facility] by CMA Ruthell Rummage.   Spent 23 minutes of non-face to face with patient and wife.  Sandrea Hughs, NP

## 2019-03-19 NOTE — Patient Instructions (Signed)
Home health Nurse to draw lab work CBC/diff and BMP in 1 week.[facility].

## 2019-03-20 ENCOUNTER — Telehealth: Payer: Self-pay

## 2019-03-20 ENCOUNTER — Telehealth: Payer: Self-pay | Admitting: *Deleted

## 2019-03-20 DIAGNOSIS — Z86718 Personal history of other venous thrombosis and embolism: Secondary | ICD-10-CM | POA: Diagnosis not present

## 2019-03-20 DIAGNOSIS — Z5181 Encounter for therapeutic drug level monitoring: Secondary | ICD-10-CM | POA: Diagnosis not present

## 2019-03-20 DIAGNOSIS — Z87891 Personal history of nicotine dependence: Secondary | ICD-10-CM | POA: Diagnosis not present

## 2019-03-20 DIAGNOSIS — R339 Retention of urine, unspecified: Secondary | ICD-10-CM | POA: Diagnosis not present

## 2019-03-20 DIAGNOSIS — N200 Calculus of kidney: Secondary | ICD-10-CM | POA: Diagnosis not present

## 2019-03-20 DIAGNOSIS — D72829 Elevated white blood cell count, unspecified: Secondary | ICD-10-CM | POA: Diagnosis not present

## 2019-03-20 DIAGNOSIS — N390 Urinary tract infection, site not specified: Secondary | ICD-10-CM | POA: Diagnosis not present

## 2019-03-20 DIAGNOSIS — R1310 Dysphagia, unspecified: Secondary | ICD-10-CM | POA: Diagnosis not present

## 2019-03-20 DIAGNOSIS — N179 Acute kidney failure, unspecified: Secondary | ICD-10-CM | POA: Diagnosis not present

## 2019-03-20 DIAGNOSIS — Z466 Encounter for fitting and adjustment of urinary device: Secondary | ICD-10-CM | POA: Diagnosis not present

## 2019-03-20 DIAGNOSIS — B379 Candidiasis, unspecified: Secondary | ICD-10-CM | POA: Diagnosis not present

## 2019-03-20 DIAGNOSIS — J61 Pneumoconiosis due to asbestos and other mineral fibers: Secondary | ICD-10-CM | POA: Diagnosis not present

## 2019-03-20 DIAGNOSIS — F0390 Unspecified dementia without behavioral disturbance: Secondary | ICD-10-CM | POA: Diagnosis not present

## 2019-03-20 LAB — CULTURE, BLOOD (ROUTINE X 2)
Culture: NO GROWTH
Culture: NO GROWTH
Special Requests: ADEQUATE
Special Requests: ADEQUATE

## 2019-03-20 NOTE — Telephone Encounter (Signed)
Tried [employer] NA

## 2019-03-20 NOTE — Telephone Encounter (Signed)
Malachy Mood with [facility] called and stated that they are seeing patient for PT. Patient has a Foley Cath 1610 that they need a verbal order to change once monthly. Also adding Home Health Aid for Dressing and bathing 2 times a week order. Is this ok. Please Advise.

## 2019-03-20 NOTE — Telephone Encounter (Signed)
Patient had telephone visit yesterday Verified patient is using [facility]. They confirmed he is using their services. Faxing orders for lab work to them.

## 2019-03-20 NOTE — Telephone Encounter (Signed)
Yes - thank you

## 2019-03-21 DIAGNOSIS — Z466 Encounter for fitting and adjustment of urinary device: Secondary | ICD-10-CM | POA: Diagnosis not present

## 2019-03-21 DIAGNOSIS — D72829 Elevated white blood cell count, unspecified: Secondary | ICD-10-CM | POA: Diagnosis not present

## 2019-03-21 DIAGNOSIS — N179 Acute kidney failure, unspecified: Secondary | ICD-10-CM | POA: Diagnosis not present

## 2019-03-21 DIAGNOSIS — N390 Urinary tract infection, site not specified: Secondary | ICD-10-CM | POA: Diagnosis not present

## 2019-03-21 DIAGNOSIS — B379 Candidiasis, unspecified: Secondary | ICD-10-CM | POA: Diagnosis not present

## 2019-03-21 DIAGNOSIS — F0390 Unspecified dementia without behavioral disturbance: Secondary | ICD-10-CM | POA: Diagnosis not present

## 2019-03-21 NOTE — Telephone Encounter (Signed)
Cheryl notified and agreed.

## 2019-03-24 DIAGNOSIS — D72829 Elevated white blood cell count, unspecified: Secondary | ICD-10-CM | POA: Diagnosis not present

## 2019-03-24 DIAGNOSIS — F0390 Unspecified dementia without behavioral disturbance: Secondary | ICD-10-CM | POA: Diagnosis not present

## 2019-03-24 DIAGNOSIS — N179 Acute kidney failure, unspecified: Secondary | ICD-10-CM | POA: Diagnosis not present

## 2019-03-24 DIAGNOSIS — Z466 Encounter for fitting and adjustment of urinary device: Secondary | ICD-10-CM | POA: Diagnosis not present

## 2019-03-24 DIAGNOSIS — B379 Candidiasis, unspecified: Secondary | ICD-10-CM | POA: Diagnosis not present

## 2019-03-24 DIAGNOSIS — N390 Urinary tract infection, site not specified: Secondary | ICD-10-CM | POA: Diagnosis not present

## 2019-03-25 DIAGNOSIS — F0390 Unspecified dementia without behavioral disturbance: Secondary | ICD-10-CM | POA: Diagnosis not present

## 2019-03-25 DIAGNOSIS — Z466 Encounter for fitting and adjustment of urinary device: Secondary | ICD-10-CM | POA: Diagnosis not present

## 2019-03-25 DIAGNOSIS — D72829 Elevated white blood cell count, unspecified: Secondary | ICD-10-CM | POA: Diagnosis not present

## 2019-03-25 DIAGNOSIS — N179 Acute kidney failure, unspecified: Secondary | ICD-10-CM | POA: Diagnosis not present

## 2019-03-25 DIAGNOSIS — N390 Urinary tract infection, site not specified: Secondary | ICD-10-CM | POA: Diagnosis not present

## 2019-03-25 DIAGNOSIS — E860 Dehydration: Secondary | ICD-10-CM | POA: Diagnosis not present

## 2019-03-25 DIAGNOSIS — B379 Candidiasis, unspecified: Secondary | ICD-10-CM | POA: Diagnosis not present

## 2019-03-25 LAB — BASIC METABOLIC PANEL
BUN: 56 — AB (ref 4–21)
Creatinine: 1.7 — AB (ref 0.6–1.3)
Glucose: 196
Potassium: 5.6 — AB (ref 3.4–5.3)
Sodium: 135 — AB (ref 137–147)

## 2019-03-25 LAB — CBC AND DIFFERENTIAL
HCT: 44 (ref 41–53)
Hemoglobin: 13.4 — AB (ref 13.5–17.5)
Neutrophils Absolute: 25
Platelets: 524 — AB (ref 150–399)
WBC: 31.1

## 2019-03-26 DIAGNOSIS — B379 Candidiasis, unspecified: Secondary | ICD-10-CM | POA: Diagnosis not present

## 2019-03-26 DIAGNOSIS — F0390 Unspecified dementia without behavioral disturbance: Secondary | ICD-10-CM | POA: Diagnosis not present

## 2019-03-26 DIAGNOSIS — D72829 Elevated white blood cell count, unspecified: Secondary | ICD-10-CM | POA: Diagnosis not present

## 2019-03-26 DIAGNOSIS — N179 Acute kidney failure, unspecified: Secondary | ICD-10-CM | POA: Diagnosis not present

## 2019-03-26 DIAGNOSIS — Z466 Encounter for fitting and adjustment of urinary device: Secondary | ICD-10-CM | POA: Diagnosis not present

## 2019-03-26 DIAGNOSIS — N390 Urinary tract infection, site not specified: Secondary | ICD-10-CM | POA: Diagnosis not present

## 2019-03-27 DIAGNOSIS — Z466 Encounter for fitting and adjustment of urinary device: Secondary | ICD-10-CM | POA: Diagnosis not present

## 2019-03-27 DIAGNOSIS — B379 Candidiasis, unspecified: Secondary | ICD-10-CM | POA: Diagnosis not present

## 2019-03-27 DIAGNOSIS — N179 Acute kidney failure, unspecified: Secondary | ICD-10-CM | POA: Diagnosis not present

## 2019-03-27 DIAGNOSIS — D72829 Elevated white blood cell count, unspecified: Secondary | ICD-10-CM | POA: Diagnosis not present

## 2019-03-27 DIAGNOSIS — N390 Urinary tract infection, site not specified: Secondary | ICD-10-CM | POA: Diagnosis not present

## 2019-03-27 DIAGNOSIS — F0390 Unspecified dementia without behavioral disturbance: Secondary | ICD-10-CM | POA: Diagnosis not present

## 2019-03-28 DIAGNOSIS — N390 Urinary tract infection, site not specified: Secondary | ICD-10-CM | POA: Diagnosis not present

## 2019-03-28 DIAGNOSIS — B379 Candidiasis, unspecified: Secondary | ICD-10-CM | POA: Diagnosis not present

## 2019-03-28 DIAGNOSIS — Z466 Encounter for fitting and adjustment of urinary device: Secondary | ICD-10-CM | POA: Diagnosis not present

## 2019-03-28 DIAGNOSIS — F0390 Unspecified dementia without behavioral disturbance: Secondary | ICD-10-CM | POA: Diagnosis not present

## 2019-03-28 DIAGNOSIS — D72829 Elevated white blood cell count, unspecified: Secondary | ICD-10-CM | POA: Diagnosis not present

## 2019-03-28 DIAGNOSIS — N179 Acute kidney failure, unspecified: Secondary | ICD-10-CM | POA: Diagnosis not present

## 2019-03-30 DIAGNOSIS — N390 Urinary tract infection, site not specified: Secondary | ICD-10-CM | POA: Diagnosis not present

## 2019-03-30 DIAGNOSIS — N179 Acute kidney failure, unspecified: Secondary | ICD-10-CM | POA: Diagnosis not present

## 2019-03-30 DIAGNOSIS — Z466 Encounter for fitting and adjustment of urinary device: Secondary | ICD-10-CM | POA: Diagnosis not present

## 2019-03-30 DIAGNOSIS — D72829 Elevated white blood cell count, unspecified: Secondary | ICD-10-CM | POA: Diagnosis not present

## 2019-03-30 DIAGNOSIS — F0390 Unspecified dementia without behavioral disturbance: Secondary | ICD-10-CM | POA: Diagnosis not present

## 2019-03-30 DIAGNOSIS — B379 Candidiasis, unspecified: Secondary | ICD-10-CM | POA: Diagnosis not present

## 2019-04-01 DIAGNOSIS — N390 Urinary tract infection, site not specified: Secondary | ICD-10-CM | POA: Diagnosis not present

## 2019-04-01 DIAGNOSIS — N179 Acute kidney failure, unspecified: Secondary | ICD-10-CM | POA: Diagnosis not present

## 2019-04-01 DIAGNOSIS — D72829 Elevated white blood cell count, unspecified: Secondary | ICD-10-CM | POA: Diagnosis not present

## 2019-04-01 DIAGNOSIS — F0390 Unspecified dementia without behavioral disturbance: Secondary | ICD-10-CM | POA: Diagnosis not present

## 2019-04-01 DIAGNOSIS — Z466 Encounter for fitting and adjustment of urinary device: Secondary | ICD-10-CM | POA: Diagnosis not present

## 2019-04-01 DIAGNOSIS — B379 Candidiasis, unspecified: Secondary | ICD-10-CM | POA: Diagnosis not present

## 2019-04-02 DIAGNOSIS — N179 Acute kidney failure, unspecified: Secondary | ICD-10-CM | POA: Diagnosis not present

## 2019-04-02 DIAGNOSIS — F0390 Unspecified dementia without behavioral disturbance: Secondary | ICD-10-CM | POA: Diagnosis not present

## 2019-04-02 DIAGNOSIS — B379 Candidiasis, unspecified: Secondary | ICD-10-CM | POA: Diagnosis not present

## 2019-04-02 DIAGNOSIS — N390 Urinary tract infection, site not specified: Secondary | ICD-10-CM | POA: Diagnosis not present

## 2019-04-02 DIAGNOSIS — D72829 Elevated white blood cell count, unspecified: Secondary | ICD-10-CM | POA: Diagnosis not present

## 2019-04-02 DIAGNOSIS — Z466 Encounter for fitting and adjustment of urinary device: Secondary | ICD-10-CM | POA: Diagnosis not present

## 2019-04-03 ENCOUNTER — Telehealth: Payer: Self-pay | Admitting: *Deleted

## 2019-04-03 DIAGNOSIS — F0390 Unspecified dementia without behavioral disturbance: Secondary | ICD-10-CM | POA: Diagnosis not present

## 2019-04-03 DIAGNOSIS — N179 Acute kidney failure, unspecified: Secondary | ICD-10-CM | POA: Diagnosis not present

## 2019-04-03 DIAGNOSIS — Z466 Encounter for fitting and adjustment of urinary device: Secondary | ICD-10-CM | POA: Diagnosis not present

## 2019-04-03 DIAGNOSIS — B379 Candidiasis, unspecified: Secondary | ICD-10-CM | POA: Diagnosis not present

## 2019-04-03 DIAGNOSIS — D72829 Elevated white blood cell count, unspecified: Secondary | ICD-10-CM | POA: Diagnosis not present

## 2019-04-03 DIAGNOSIS — N390 Urinary tract infection, site not specified: Secondary | ICD-10-CM | POA: Diagnosis not present

## 2019-04-03 NOTE — Telephone Encounter (Signed)
Ask [facility] nurse to fax U/A and C/S lab result ASAP for evaluation.

## 2019-04-03 NOTE — Telephone Encounter (Signed)
Received and placed in Dinah's folder for review.

## 2019-04-03 NOTE — Telephone Encounter (Signed)
Roger Gardner, Daughter called back and stated that patient just got out of the hospital a couple of weeks ago. Stated that he had yeast in his urine and had kidney stones in both kidneys. They gave him antibiotics to help with WBC Count and fluids.  Daughter is wanting to know if it is necessary to take him back to the ER. Wants to know if Antibiotics can just be given. Please Advise.

## 2019-04-03 NOTE — Telephone Encounter (Signed)
[name] Notified and agreed.

## 2019-04-03 NOTE — Telephone Encounter (Signed)
Okay.  Thanks.

## 2019-04-03 NOTE — Telephone Encounter (Signed)
Patient's labs received from [facility] Nurse Glucose 196,BUN 56,CR 1.65,Ca 10.3,Potassium 5.6,WBC 31.1 and Urine analysis shows clear yellow urine large blood,moderate Leukocyte and large bacteria noted.I recommend patient to evaluated in the ED as soon as possible.Suspect possible acute renal failure,dehydration and UTI.

## 2019-04-03 NOTE — Telephone Encounter (Signed)
Mickel Crow with [facility] called and stated that she saw patient today and he has POSITIVE UTI. Stated that he had a urine specimen done and the lab is going to fax it over. Urine has a strong foul odor and very dark.  Patient is Not currently taking antibiotics.  Nurse has shown family how to flush the Cath due to sediment.  Stated that patient has a fever of 99.1. All other vitals normal.  Please Advise.  (forwarded to [place] due to Janett Billow out of office.)

## 2019-04-03 NOTE — Telephone Encounter (Signed)
Arbie Cookey, Nurse and [name], daughter notified. [name] stated that she will speak with the rest of the family regarding taking him to the hospital.

## 2019-04-03 NOTE — Telephone Encounter (Signed)
WBC are too high.Needs close monitoring to prevent sepsis.

## 2019-04-04 ENCOUNTER — Other Ambulatory Visit (HOSPITAL_COMMUNITY)
Admission: AD | Admit: 2019-04-04 | Discharge: 2019-04-04 | Disposition: A | Discharge status: Home / Self Care | Payer: Medicare Other | Source: Skilled Nursing Facility | Attending: Internal Medicine | Admitting: Internal Medicine

## 2019-04-04 DIAGNOSIS — Z466 Encounter for fitting and adjustment of urinary device: Secondary | ICD-10-CM | POA: Diagnosis not present

## 2019-04-04 DIAGNOSIS — N390 Urinary tract infection, site not specified: Secondary | ICD-10-CM | POA: Insufficient documentation

## 2019-04-04 DIAGNOSIS — B379 Candidiasis, unspecified: Secondary | ICD-10-CM | POA: Diagnosis not present

## 2019-04-04 DIAGNOSIS — D72829 Elevated white blood cell count, unspecified: Secondary | ICD-10-CM | POA: Insufficient documentation

## 2019-04-04 DIAGNOSIS — N179 Acute kidney failure, unspecified: Secondary | ICD-10-CM | POA: Insufficient documentation

## 2019-04-04 DIAGNOSIS — F0390 Unspecified dementia without behavioral disturbance: Secondary | ICD-10-CM | POA: Diagnosis not present

## 2019-04-04 LAB — CBC WITH DIFFERENTIAL/PLATELET
Abs Immature Granulocytes: 0.44 10*3/uL — ABNORMAL HIGH (ref 0.00–0.07)
Basophils Absolute: 0.1 10*3/uL (ref 0.0–0.1)
Basophils Relative: 0 %
Eosinophils Absolute: 0.1 10*3/uL (ref 0.0–0.5)
Eosinophils Relative: 0 %
HCT: 41.6 % (ref 39.0–52.0)
Hemoglobin: 12 g/dL — ABNORMAL LOW (ref 13.0–17.0)
Immature Granulocytes: 2 %
Lymphocytes Relative: 16 %
Lymphs Abs: 3.8 10*3/uL (ref 0.7–4.0)
MCH: 26.1 pg (ref 26.0–34.0)
MCHC: 28.8 g/dL — ABNORMAL LOW (ref 30.0–36.0)
MCV: 90.4 fL (ref 80.0–100.0)
Monocytes Absolute: 1 10*3/uL (ref 0.1–1.0)
Monocytes Relative: 4 %
Neutro Abs: 18.3 10*3/uL — ABNORMAL HIGH (ref 1.7–7.7)
Neutrophils Relative %: 78 %
Platelets: 476 10*3/uL — ABNORMAL HIGH (ref 150–400)
RBC: 4.6 MIL/uL (ref 4.22–5.81)
RDW: 13.8 % (ref 11.5–15.5)
WBC: 23.7 10*3/uL — ABNORMAL HIGH (ref 4.0–10.5)
nRBC: 0 % (ref 0.0–0.2)

## 2019-04-04 LAB — COMPREHENSIVE METABOLIC PANEL
ALT: 50 U/L — ABNORMAL HIGH (ref 0–44)
AST: 52 U/L — ABNORMAL HIGH (ref 15–41)
Albumin: 2.8 g/dL — ABNORMAL LOW (ref 3.5–5.0)
Alkaline Phosphatase: 101 U/L (ref 38–126)
Anion gap: 12 (ref 5–15)
BUN: 44 mg/dL — ABNORMAL HIGH (ref 8–23)
CO2: 27 mmol/L (ref 22–32)
Calcium: 8.9 mg/dL (ref 8.9–10.3)
Chloride: 96 mmol/L — ABNORMAL LOW (ref 98–111)
Creatinine, Ser: 1.47 mg/dL — ABNORMAL HIGH (ref 0.61–1.24)
GFR calc Af Amer: 46 mL/min — ABNORMAL LOW (ref >60)
GFR calc non Af Amer: 40 mL/min — ABNORMAL LOW (ref >60)
Glucose, Bld: 138 mg/dL — ABNORMAL HIGH (ref 70–99)
Potassium: 4.4 mmol/L (ref 3.5–5.1)
Sodium: 135 mmol/L (ref 135–145)
Total Bilirubin: 0.4 mg/dL (ref 0.3–1.2)
Total Protein: 7.9 g/dL (ref 6.5–8.1)

## 2019-04-04 LAB — CBC AND DIFFERENTIAL
HCT: 42 (ref 41–53)
Hemoglobin: 12 — AB (ref 13.5–17.5)
Platelets: 476 — AB (ref 150–399)
WBC: 23.7

## 2019-04-04 LAB — URINALYSIS, ROUTINE W REFLEX MICROSCOPIC
Bilirubin Urine: NEGATIVE
Glucose, UA: NEGATIVE mg/dL
Ketones, ur: NEGATIVE mg/dL
Nitrite: NEGATIVE
Protein, ur: 100 mg/dL — AB
RBC / HPF: >50 RBC/hpf — ABNORMAL HIGH (ref 0–5)
Specific Gravity, Urine: 1.02 (ref 1.005–1.030)
WBC, UA: >50 WBC/hpf — ABNORMAL HIGH (ref 0–5)
pH: 7 (ref 5.0–8.0)

## 2019-04-04 LAB — BASIC METABOLIC PANEL
BUN: 44 — AB (ref 4–21)
Creatinine: 1.5 — AB (ref 0.6–1.3)
Glucose: 138
Potassium: 4.4 (ref 3.4–5.3)
Sodium: 135 — AB (ref 137–147)

## 2019-04-04 LAB — HEPATIC FUNCTION PANEL
ALT: 50 — AB (ref 10–40)
AST: 52 — AB (ref 14–40)
Alkaline Phosphatase: 101 (ref 25–125)
Bilirubin, Total: 0.4

## 2019-04-04 NOTE — Telephone Encounter (Signed)
Stat CBC/diff,CMP and urine specimen for U/A and C/S

## 2019-04-04 NOTE — Telephone Encounter (Signed)
Arbie Cookey, Nurse with [facility] called and stated that the Family Does Not and Did Not take patient to the ER. Stated that his intake is pretty good. Stated he has Advanced Alzheimer's and Bed Bound. Nurse and family would like to treat at home. Nurse stated that she would like to recheck patient's bloodwork and urine since the last was 6 days ago. Please Advise.

## 2019-04-04 NOTE — Telephone Encounter (Signed)
Arbie Cookey, Nurse Notified and will Draw today.

## 2019-04-06 LAB — URINE CULTURE: Culture: NO GROWTH

## 2019-04-07 ENCOUNTER — Encounter: Payer: Self-pay | Admitting: *Deleted

## 2019-04-07 DIAGNOSIS — N179 Acute kidney failure, unspecified: Secondary | ICD-10-CM | POA: Diagnosis not present

## 2019-04-07 DIAGNOSIS — D72829 Elevated white blood cell count, unspecified: Secondary | ICD-10-CM | POA: Diagnosis not present

## 2019-04-07 DIAGNOSIS — Z466 Encounter for fitting and adjustment of urinary device: Secondary | ICD-10-CM | POA: Diagnosis not present

## 2019-04-07 DIAGNOSIS — B379 Candidiasis, unspecified: Secondary | ICD-10-CM | POA: Diagnosis not present

## 2019-04-07 DIAGNOSIS — F0390 Unspecified dementia without behavioral disturbance: Secondary | ICD-10-CM | POA: Diagnosis not present

## 2019-04-07 DIAGNOSIS — N390 Urinary tract infection, site not specified: Secondary | ICD-10-CM | POA: Diagnosis not present

## 2019-04-08 ENCOUNTER — Telehealth: Payer: Self-pay | Admitting: Family

## 2019-04-08 DIAGNOSIS — B379 Candidiasis, unspecified: Secondary | ICD-10-CM | POA: Diagnosis not present

## 2019-04-08 DIAGNOSIS — N179 Acute kidney failure, unspecified: Secondary | ICD-10-CM | POA: Diagnosis not present

## 2019-04-08 DIAGNOSIS — F0390 Unspecified dementia without behavioral disturbance: Secondary | ICD-10-CM | POA: Diagnosis not present

## 2019-04-08 DIAGNOSIS — D72829 Elevated white blood cell count, unspecified: Secondary | ICD-10-CM | POA: Diagnosis not present

## 2019-04-08 DIAGNOSIS — N390 Urinary tract infection, site not specified: Secondary | ICD-10-CM | POA: Diagnosis not present

## 2019-04-08 DIAGNOSIS — Z466 Encounter for fitting and adjustment of urinary device: Secondary | ICD-10-CM | POA: Diagnosis not present

## 2019-04-08 NOTE — Telephone Encounter (Signed)
Patient's lab results indicated elevated WBC 23.7,anemia and elevated liver enzyme and high renal functions.Urine specimen indicates no Urinary tract infections.Recommend follow up at the office to evaluate.

## 2019-04-08 NOTE — Telephone Encounter (Signed)
Roger Gardner, daughter notified. Stated that she will call us back to schedule an appointment because she has to find someone to help him get up and into car.

## 2019-04-08 NOTE — Telephone Encounter (Signed)
Send patient to ED for evaluation of causes of infection,renal failure and elevated liver function.

## 2019-04-08 NOTE — Telephone Encounter (Signed)
Roger Gardner, daughter called back and stated that the Home Health Nurse thought it would NOT be a good idea for patient to come to a visit. Stated that he is not walking, High Risk for falls and Incontinent. Stated that they would have to have an ambulance transport him. Wants to do a TeleVisit instead. Please Advise.

## 2019-04-09 ENCOUNTER — Encounter: Payer: Self-pay | Admitting: Family

## 2019-04-09 ENCOUNTER — Ambulatory Visit (INDEPENDENT_AMBULATORY_CARE_PROVIDER_SITE_OTHER): Payer: Medicare Other | Admitting: Family

## 2019-04-09 ENCOUNTER — Other Ambulatory Visit: Payer: Self-pay

## 2019-04-09 DIAGNOSIS — N183 Chronic kidney disease, stage 3 unspecified: Secondary | ICD-10-CM

## 2019-04-09 DIAGNOSIS — R2681 Unsteadiness on feet: Secondary | ICD-10-CM

## 2019-04-09 DIAGNOSIS — R748 Abnormal levels of other serum enzymes: Secondary | ICD-10-CM | POA: Diagnosis not present

## 2019-04-09 DIAGNOSIS — F0390 Unspecified dementia without behavioral disturbance: Secondary | ICD-10-CM

## 2019-04-09 DIAGNOSIS — L89212 Pressure ulcer of right hip, stage 2: Secondary | ICD-10-CM | POA: Diagnosis not present

## 2019-04-09 DIAGNOSIS — E8809 Other disorders of plasma-protein metabolism, not elsewhere classified: Secondary | ICD-10-CM

## 2019-04-09 DIAGNOSIS — D72829 Elevated white blood cell count, unspecified: Secondary | ICD-10-CM | POA: Diagnosis not present

## 2019-04-09 DIAGNOSIS — R531 Weakness: Secondary | ICD-10-CM | POA: Diagnosis not present

## 2019-04-09 MED ORDER — DOXYCYCLINE HYCLATE 100 MG PO TABS: 100.0000 mg | ORAL_TABLET | Freq: Two times a day (BID) | ORAL | 0 refills | Status: AC

## 2019-04-09 MED ORDER — SACCHAROMYCES BOULARDII 250 MG PO CAPS: 250.0000 mg | ORAL_CAPSULE | Freq: Two times a day (BID) | ORAL | 0 refills | Status: AC

## 2019-04-09 NOTE — Telephone Encounter (Signed)
Patient daughter, [name] notified. Wants a TeleVisit to discuss. Home Health nurse is telling them patient can be treated at home.  Scheduled TeleVisit with Dinah at 1:30.

## 2019-04-09 NOTE — Progress Notes (Signed)
This service is provided via telemedicine  No vital signs collected/recorded due to the encounter was a telemedicine visit.   Location of patient (ex: home, work):  Home   Patient consents to a telephone visit:  Yes  Location of the provider (ex: office, home):  Office   Name of any referring provider: Sherrie Mustache, NP   Names of all persons participating in the telemedicine service and their role in the encounter:  Marlowe Sax, NP, Ruthell Rummage CMA, Lacey Jensen and daughter [name]  Time spent on call:  Ruthell Rummage CMA, spent 6 minutes on phone with patient     Provider: Alaa Eyerman FNP-C  [name], NP  Patient Care Team: [name], NP as PCP - General (Geriatric Medicine)  Extended Emergency Contact Information Primary Emergency Contact: Lavone Nian Mobile Phone: [phone] Relation: Daughter Secondary Emergency Contact: Korson,BETTY Address: [place],  [place] Home Phone: [phone] Relation: None  Code Status:  DNR Goals of care: Advanced Directive information Advanced Directives 03/16/2019  Does Patient Have a Medical Advance Directive? Unable to assess, patient is non-responsive or altered mental status  Type of Advance Directive -  Does patient want to make changes to medical advance directive? -  Copy of [place] in Chart? -  Would patient like information on creating a medical advance directive? No - Guardian declined     Chief Complaint  Patient presents with  . Acute Visit    Would like to discuss care going forward, lab work, and patient's fear and anxiety. Home health nurse would like to order hospital bed for patient.     HPI:  Pt is a 85 y.o. male seen today for an acute visit for evaluation of abnormal lab results.His urine specimen for U/A and C/S results were negative for UTI.His CBC/diff showed elevated WBC 23.7,Hgb 12.0,BUN 44,CR 1.47,AST 52,ALT 50 and ALB  2.8.previous BUN 56,CR 1.7 and WBC 31 He has an indwelling foley cater managed by [facility] Nurse.Patient daughter and Claiborne Billings the daughter in law who is a Marine scientist provides the HPI information.Daughter states patient doing much better since his last hospital visit.she wonders whether the worsening renal functions due to previous kidney stone diagnosed on last hospital visit.she states patient does not like to drink water but drinks milk and cranberry juice about 12 oz seven times a day.His foley catheter puts out 300-500 cc twice a day.He continues to work with Physical.  He require a semi [facility] bed  with  rails to assist in  Repositioning in ways not feasible with a normal bed.Patient suffers from generalized weakness,unsteady gait and has a right hip stage 2 ulcer that requires frequent and immediate changes in position which cannot be achieved with a normal bed.He will also require an alternating pressure pump air mattress to prevent further skin breakdown and promote wound healing.Wound continue to be monitor and managed by Home health Nurse.    Past Medical History:  Diagnosis Date  . 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 Dementia ([place])   . Flu 1999   Hospitalized by Dr. Lenord Fellers IM   Past Surgical History:  Procedure Laterality Date  . HERNIA REPAIR    . KIDNEY STONE SURGERY     Basket extraction by Dr. Michela Pitcher     Allergies  Allergen Reactions  . Penicillins Shortness Of Breath and Rash    Did it involve swelling of  the face/tongue/throat, SOB, or low BP? Unknown Did it involve sudden or severe rash/hives, skin peeling, or any reaction on the inside of your mouth or nose? Unknown Did you need to seek medical attention at a hospital or doctor's office? Unknown When did it last happen? 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    . Codeine Other (See Comments)    Altered mental status    Outpatient Encounter Medications as of 04/09/2019  Medication Sig  . acetaminophen (TYLENOL) 325 MG tablet Take 2 tablets (650 mg total) by mouth every 6 (six) hours as needed for mild pain (or Fever >/= 101).  Marland Kitchen loratadine (CLARITIN) 10 MG tablet Take 10 mg by mouth as needed for allergies.   No facility-administered encounter medications on file as of 04/09/2019.     Review of Systems  Constitutional: Negative for appetite change, chills, fatigue and fever.  HENT: Negative for congestion, rhinorrhea, sinus pressure, sinus pain, sneezing and sore throat.   Respiratory: Negative for cough, chest tightness, shortness of breath and wheezing.   Cardiovascular: Negative for chest pain, palpitations and leg swelling.  Gastrointestinal: Negative for abdominal distention, abdominal pain, constipation, diarrhea, nausea and vomiting.  Genitourinary: Negative for difficulty urinating and urgency.       Indwelling foley catheter   Musculoskeletal: Positive for gait problem.       Unsteady gait continues to work with Physical Therapy.  Skin: Positive for wound. Negative for color change, pallor and rash.       Right hip stage 2 ulcer with surrounding skin redness.No drainage.   Neurological: Negative for dizziness, light-headedness and headaches.       Generalized weakness  Psychiatric/Behavioral: Negative for agitation and sleep disturbance. The patient is not nervous/anxious.        Dementia     There is no immunization history on file for this patient. Pertinent  Health Maintenance Due  Topic Date Due  . INFLUENZA VACCINE  02/22/2019  . PNA vac Low Risk Adult (1 of 2 - PCV13) 09/27/2019 (Originally 10/08/1986)   Fall Risk  04/09/2019 03/19/2019 03/14/2019 03/06/2019 03/04/2019  Falls in the past year? 0 0 1 0 1  Number falls in past yr: 0 0 0 0 1  Injury with Fall? 0 0 0 0 0  Risk for fall due to : - - - - -   There were no vitals  filed for this visit. There is no height or weight on file to calculate BMI. Physical Exam Unable to complete on Telephone visit.  Labs reviewed: Recent Labs    03/17/19 0422 03/17/19 0423 03/18/19 0707 03/25/19 04/04/19 04/04/19 2015  NA  --  146* 141 135* 135* 135  K  --  4.5 4.8 5.6* 4.4 4.4  CL  --  115* 108  --   --  96*  CO2  --  23 26  --   --  27  GLUCOSE  --  78 92  --   --  138*  BUN  --  61* 38* 56* 44* 44*  CREATININE  --  2.16* 1.43* 1.7* 1.5* 1.47*  CALCIUM  --  8.9 9.3  --   --  8.9  MG 2.3  --   --   --   --   --   PHOS  --  3.6  --   --   --   --  Recent Labs    03/15/19 1647 03/16/19 0604 03/17/19 0423 04/04/19 04/04/19 2015  AST 30 24  --  52* 52*  ALT 21 19  --  50* 50*  ALKPHOS 76 65  --  101 101  BILITOT 0.7 0.6  --   --  0.4  PROT 7.4 6.6  --   --  7.9  ALBUMIN 3.1* 2.6* 2.6*  --  2.8*   Recent Labs    03/15/19 1647 03/16/19 0604 03/17/19 0422 03/25/19 04/04/19 04/04/19 2015  WBC 25.2* 18.9* 13.6* 31.1 23.7 23.7*  NEUTROABS 21.5*  --   --  25  --  18.3*  HGB 12.8* 11.1* 10.9* 13.4* 12.0* 12.0*  HCT 41.3 36.5* 36.8* 44 42 41.6  MCV 86.6 88.8 90.6  --   --  90.4  PLT 443* 419* 428* 524* 476* 476*   No results found for: TSH No results found for: HGBA1C No results found for: CHOL, HDL, LDLCALC, LDLDIRECT, TRIG, CHOLHDL  Significant Diagnostic Results in last 30 days:  Ct Head Wo Contrast  Result Date: 03/15/2019 CLINICAL DATA:  85 year old with dementia. Focal neuro deficit, > 6 hrs, stroke suspected EXAM: CT HEAD WITHOUT CONTRAST TECHNIQUE: Contiguous axial images were obtained from the base of the skull through the vertex without intravenous contrast. COMPARISON:  Remote head CT 12/27/2009 FINDINGS: Brain: No intracranial hemorrhage, mass effect, or midline shift. Generalized atrophy, progressed from 2011. Remote lacunar infarct in the left basal ganglia and periventricular white matter. Advanced chronic small vessel ischemia. No  hydrocephalus. The basilar cisterns are patent. No evidence of territorial infarct or acute ischemia. No extra-axial or intracranial fluid collection. Vascular: Atherosclerosis of skullbase vasculature without hyperdense vessel or abnormal calcification. Skull: No fracture or focal lesion. Sinuses/Orbits: Mild mucosal thickening of right maxillary sinus with tiny mucous retention cysts. No acute findings. Bilateral cataract resection. Other: None. IMPRESSION: 1. No acute intracranial abnormality. 2. Generalized atrophy and advanced chronic small vessel ischemia. Remote lacunar infarcts in the left basal ganglia and periventricular white matter. Electronically Signed   By: Keith Rake M.D.   On: 03/15/2019 20:39   US Renal  Result Date: 03/16/2019 CLINICAL DATA:  Acute kidney injury. EXAM: RENAL / URINARY TRACT ULTRASOUND COMPLETE COMPARISON:  03/15/2019 CT FINDINGS: Right Kidney: Renal measurements: 12.2 x 3.9 x 4.3 centimeters = volume: 107 mL. 5 millimeter calculus is identified in the LOWER pole region. A cyst in the LOWER pole is 9 millimeters. No hydronephrosis or solid renal mass. There is thinning of the renal parenchyma. Parenchyma appears echogenic. Left Kidney: Renal measurements: 11.5 x 6.5 x 4.5 centimeters = volume: 164 mL. Echogenicity within normal limits. No mass or hydronephrosis visualized. Bladder: Foley catheter decompresses the bladder. IMPRESSION: 1. No hydronephrosis. 2. RIGHT renal parenchyma appears slightly more echogenic and thinned compared to the LEFT kidney. 3. 5 millimeter calculus in the LOWER pole the RIGHT kidney. Benign RIGHT renal cyst. Electronically Signed   By: Nolon Nations M.D.   On: 03/16/2019 12:19   Dg Chest Portable 1 View  Result Date: 03/15/2019 CLINICAL DATA:  Altered mental status.  History of asbestosis EXAM: PORTABLE CHEST 1 VIEW COMPARISON:  06/16/2016 FINDINGS: Mild cardiomegaly. No confluent opacities, effusions or edema. No acute bony  abnormality. IMPRESSION: Mild cardiomegaly.  No active disease. Electronically Signed   By: Rolm Baptise M.D.   On: 03/15/2019 18:18   Ct Renal Stone Study  Result Date: 03/15/2019 CLINICAL DATA:  Hematuria EXAM: CT ABDOMEN AND PELVIS WITHOUT CONTRAST TECHNIQUE:  Multidetector CT imaging of the abdomen and pelvis was performed following the standard protocol without IV contrast. COMPARISON:  None. FINDINGS: Lower chest: Bibasilar atelectasis. Heart is mildly enlarged. Moderate-sized hiatal hernia. Hepatobiliary: No focal hepatic abnormality. Gallbladder unremarkable. Pancreas: No focal abnormality or ductal dilatation. Spleen: No focal abnormality.  Normal size. Adrenals/Urinary Tract: Bilateral nephrolithiasis with largest stone in the right midpole measuring 6 mm. Mild fullness of the renal collecting systems bilaterally. No ureteral stones. Urinary bladder is decompressed with Foley catheter in place. Bladder wall is difficult to evaluate. Adrenal glands unremarkable. Stomach/Bowel: Sigmoid diverticulosis. No active diverticulitis. Stomach and small bowel decompressed, grossly unremarkable. Vascular/Lymphatic: Aortic atherosclerosis. No enlarged abdominal or pelvic lymph nodes. Reproductive: Prostate enlargement. Other: No free fluid or free air. Musculoskeletal: No acute bony abnormality. IMPRESSION: There is mild fullness of the renal collecting system and ureters bilaterally without ureteral stone. Bilateral nephrolithiasis. Sigmoid diverticulosis. Aortic atherosclerosis. Prostate enlargement. Electronically Signed   By: Rolm Baptise M.D.   On: 03/15/2019 22:52    Assessment/Plan 1. Pressure ulcer of right hip, stage 2 (HCC) Afebrile.per-wound cellulitis reported.No drainage.  - doxycycline (VIBRA-TABS) 100 MG tablet; Take 1 tablet (100 mg total) by mouth 2 (two) times daily for 7 days.  Dispense: 20 tablet; Refill: 0 - saccharomyces boulardii (FLORASTOR) 250 MG capsule; Take 1 capsule (250 mg total)  by mouth 2 (two) times daily for 10 days.  Dispense: 20 capsule; Refill: 0 - will require an alternating pressure pump air mattress to promote wound healing and prevent further skin breakdown.Also requires semi-electric Hospital bed to assist in the repositioning in the bed in ways not feasible with an ordinary bed.   2. Elevated liver enzymes Unclear etiology.Daughter declines ER evaluation.would like Palliative care ordered due to patient's advance age and dementia.    3. Hypoalbuminemia Encouraged to add Ensure daily.  4. Leukocytosis, unspecified type Suspect due to right hip peri-wound cellulitis.  - doxycycline (VIBRA-TABS) 100 MG tablet; Take 1 tablet (100 mg total) by mouth 2 (two) times daily for 7 days.  Dispense: 20 tablet; Refill: 0 - saccharomyces boulardii (FLORASTOR) 250 MG capsule; Take 1 capsule (250 mg total) by mouth 2 (two) times daily for 10 days.  Dispense: 20 capsule; Refill: 0  5. Chronic kidney disease (CKD), stage III (moderate) (HCC) Does not like to drink water.Enouraged fluid intake.  6. Dementia without behavioral disturbance, unspecified dementia type ([place]) Progressive decline expected. - For home use only DME as above.  - Amb Referral to Palliative Care  7. Unsteady gait Continue with Physical Therapy.fall and safety precautions.   8. Generalized weakness Encourage oral intake. Doxycycline as above.   Family/ staff Communication: Reviewed plan of care with patient, Daughter and Daughter-in law.   Labs/tests ordered: None   Spent 23 minutes of non-face to face with patient    Sandrea Hughs, NP

## 2019-04-11 ENCOUNTER — Telehealth: Payer: Self-pay | Admitting: *Deleted

## 2019-04-11 DIAGNOSIS — N390 Urinary tract infection, site not specified: Secondary | ICD-10-CM | POA: Diagnosis not present

## 2019-04-11 DIAGNOSIS — D72829 Elevated white blood cell count, unspecified: Secondary | ICD-10-CM | POA: Diagnosis not present

## 2019-04-11 DIAGNOSIS — B379 Candidiasis, unspecified: Secondary | ICD-10-CM | POA: Diagnosis not present

## 2019-04-11 DIAGNOSIS — Z466 Encounter for fitting and adjustment of urinary device: Secondary | ICD-10-CM | POA: Diagnosis not present

## 2019-04-11 DIAGNOSIS — F0390 Unspecified dementia without behavioral disturbance: Secondary | ICD-10-CM | POA: Diagnosis not present

## 2019-04-11 DIAGNOSIS — N179 Acute kidney failure, unspecified: Secondary | ICD-10-CM | POA: Diagnosis not present

## 2019-04-11 NOTE — Telephone Encounter (Signed)
Received Home Medical Equipment Form from [place] ([place]) [phone] from [place] wrote for Hospital Bed.  Filled out and placed in Roger Gardner's folder to review. To be faxed back to Fax: [phone]

## 2019-04-15 ENCOUNTER — Telehealth: Payer: Self-pay | Admitting: Internal Medicine

## 2019-04-15 DIAGNOSIS — D72829 Elevated white blood cell count, unspecified: Secondary | ICD-10-CM | POA: Diagnosis not present

## 2019-04-15 DIAGNOSIS — Z466 Encounter for fitting and adjustment of urinary device: Secondary | ICD-10-CM | POA: Diagnosis not present

## 2019-04-15 DIAGNOSIS — N390 Urinary tract infection, site not specified: Secondary | ICD-10-CM | POA: Diagnosis not present

## 2019-04-15 DIAGNOSIS — N179 Acute kidney failure, unspecified: Secondary | ICD-10-CM | POA: Diagnosis not present

## 2019-04-15 DIAGNOSIS — B379 Candidiasis, unspecified: Secondary | ICD-10-CM | POA: Diagnosis not present

## 2019-04-15 DIAGNOSIS — F0390 Unspecified dementia without behavioral disturbance: Secondary | ICD-10-CM | POA: Diagnosis not present

## 2019-04-15 NOTE — Telephone Encounter (Signed)
Spoke with patient's daughter [name] regarding Palliative services and she requested that I call and schedule Consult with her Pierre Bali, [name].  I then called patient's home number with no answer and I was unable to leave a message due to no voicemail set up.  Will follow-up tomorrow.

## 2019-04-16 ENCOUNTER — Telehealth: Payer: Self-pay | Admitting: *Deleted

## 2019-04-16 ENCOUNTER — Telehealth: Payer: Self-pay | Admitting: Internal Medicine

## 2019-04-16 DIAGNOSIS — F0390 Unspecified dementia without behavioral disturbance: Secondary | ICD-10-CM | POA: Diagnosis not present

## 2019-04-16 DIAGNOSIS — D72829 Elevated white blood cell count, unspecified: Secondary | ICD-10-CM | POA: Diagnosis not present

## 2019-04-16 DIAGNOSIS — N390 Urinary tract infection, site not specified: Secondary | ICD-10-CM | POA: Diagnosis not present

## 2019-04-16 DIAGNOSIS — Z466 Encounter for fitting and adjustment of urinary device: Secondary | ICD-10-CM | POA: Diagnosis not present

## 2019-04-16 DIAGNOSIS — B379 Candidiasis, unspecified: Secondary | ICD-10-CM | POA: Diagnosis not present

## 2019-04-16 DIAGNOSIS — N179 Acute kidney failure, unspecified: Secondary | ICD-10-CM | POA: Diagnosis not present

## 2019-04-16 NOTE — Telephone Encounter (Signed)
Called patient's home # again to schedule Consult, no answer and unable to leave message due to no voicemail.  I then called [name] back to let her know that I was unable to reach her parents yesterday and this morning.  Daughter didn't answer so I left message with my name and contact information

## 2019-04-16 NOTE — Telephone Encounter (Signed)
Yes

## 2019-04-16 NOTE — Telephone Encounter (Signed)
Returned call to patient's wife Roger Gardner to schedule Palliative Consult, and Roger Gardner gave the phone to her daughter-in-law Roger Gardner to talk with me.  Roger Gardner stated that the RN from [facility] was there and they are going to send in a referral for Hospice due to patient's decline.  I asked which Hospice were they going to refer him to and she said [facility] and she asked if that's where I was calling from and I said no.  She said that they were under the impression that I was calling from [facility] and I said I was calling from [facility]  Family has decided to go with [facility] so I will cancel this referral and notify MD office of above.

## 2019-04-16 NOTE — Telephone Encounter (Signed)
[place] Notified and agreed.

## 2019-04-16 NOTE — Telephone Encounter (Signed)
Casandra with [facility] called and wanted to know If Janett Billow will be patient's Attending. Please Advise.

## 2019-04-17 DIAGNOSIS — G311 Senile degeneration of brain, not elsewhere classified: Secondary | ICD-10-CM | POA: Diagnosis not present

## 2019-04-17 DIAGNOSIS — R1310 Dysphagia, unspecified: Secondary | ICD-10-CM | POA: Diagnosis not present

## 2019-04-17 DIAGNOSIS — N183 Chronic kidney disease, stage 3 (moderate): Secondary | ICD-10-CM | POA: Diagnosis not present

## 2019-04-18 DIAGNOSIS — G311 Senile degeneration of brain, not elsewhere classified: Secondary | ICD-10-CM | POA: Diagnosis not present

## 2019-04-18 DIAGNOSIS — R1310 Dysphagia, unspecified: Secondary | ICD-10-CM | POA: Diagnosis not present

## 2019-04-18 DIAGNOSIS — N183 Chronic kidney disease, stage 3 (moderate): Secondary | ICD-10-CM | POA: Diagnosis not present

## 2019-04-24 DEATH — deceased

## 2020-11-03 IMAGING — CT CT HEAD WITHOUT CONTRAST
3 series · 16 of 47 positions shown, 19 images · non-contrast
Comparison: Remote head CT 12/27/2009

CLINICAL DATA: [AGE] with dementia. Focal neuro deficit, > 6
hrs, stroke suspected

EXAM:
CT HEAD WITHOUT CONTRAST
TECHNIQUE: Contiguous axial images were obtained from the base of the skull
through the vertex without intravenous contrast.

[Series 2: head w o · axial · 0.47mm/px · z∈[+1318,+1478]mm · 10 of 38 slices shown, 13 images]
[im 3/38  brain]
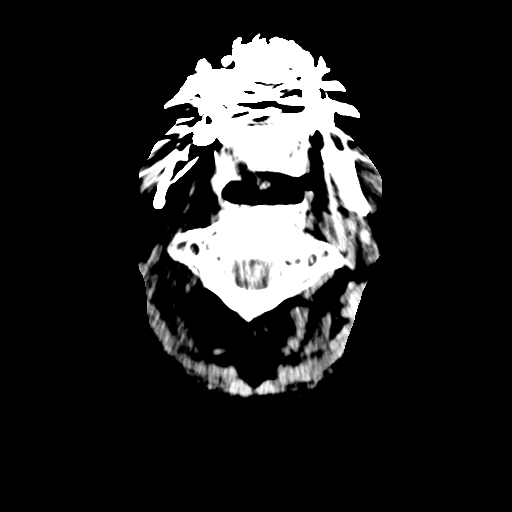
[im 3/38  bone]
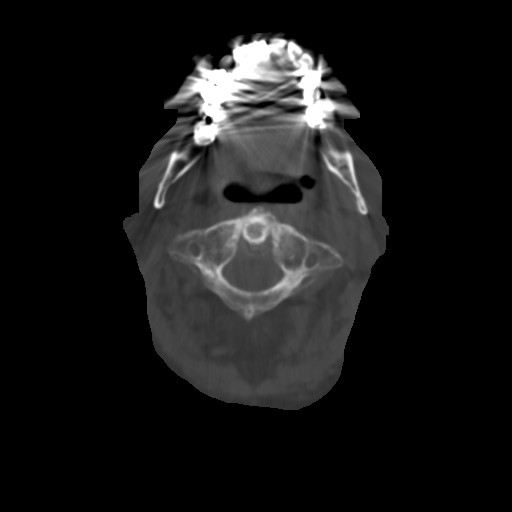
[im 7/38  brain]
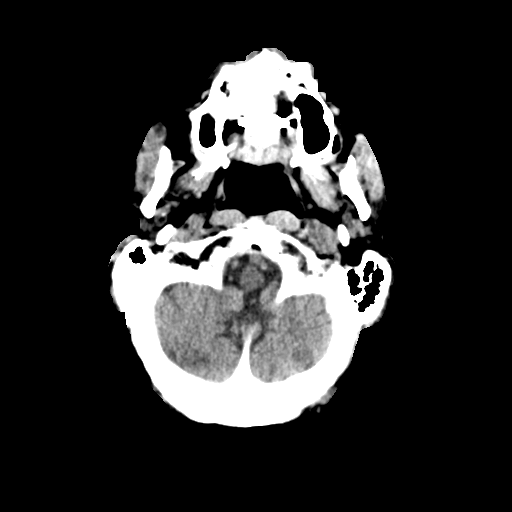
[im 11/38  brain]
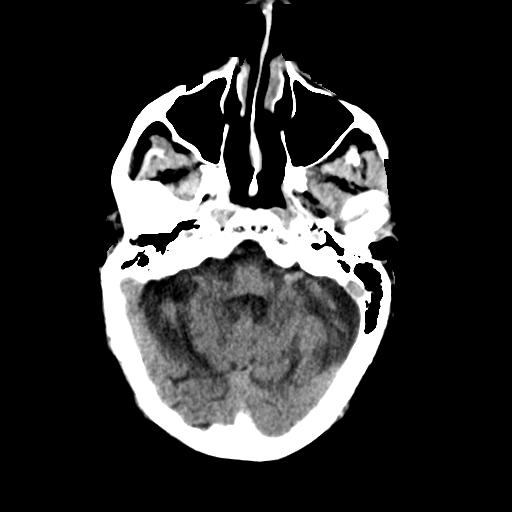
[im 13/38  brain]
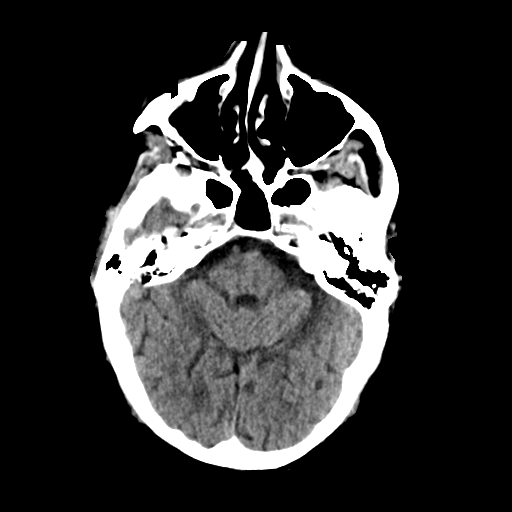
[im 17/38  brain]
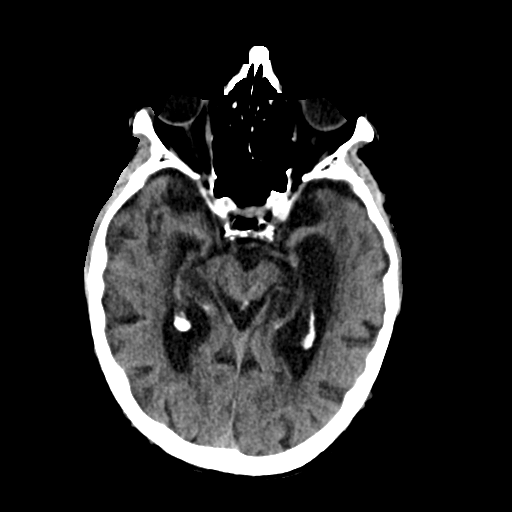
[im 17/38  bone]
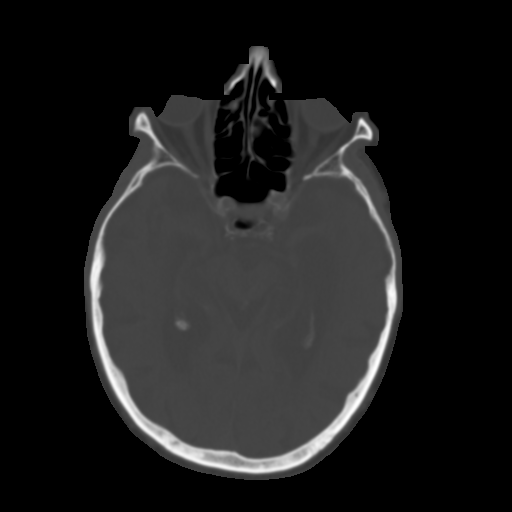
[im 21/38  brain]
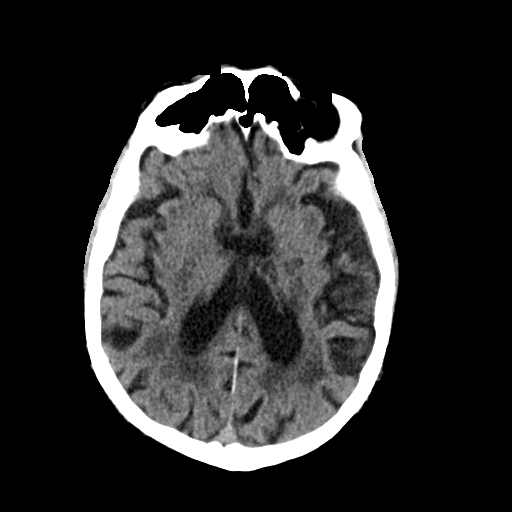
[im 25/38  brain]
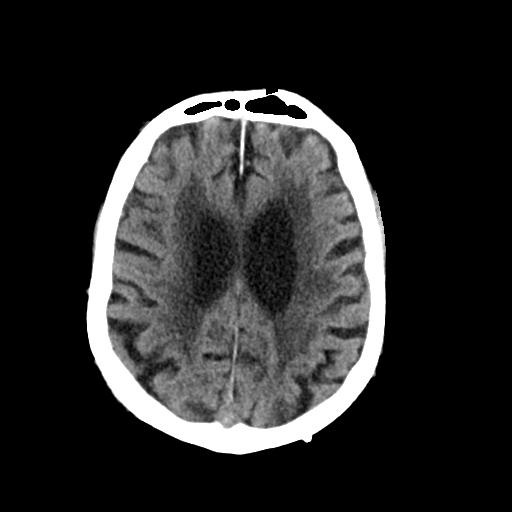
[im 29/38  brain]
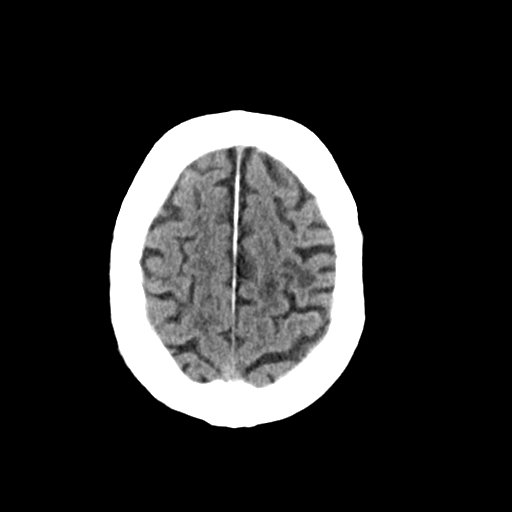
[im 31/38  brain]
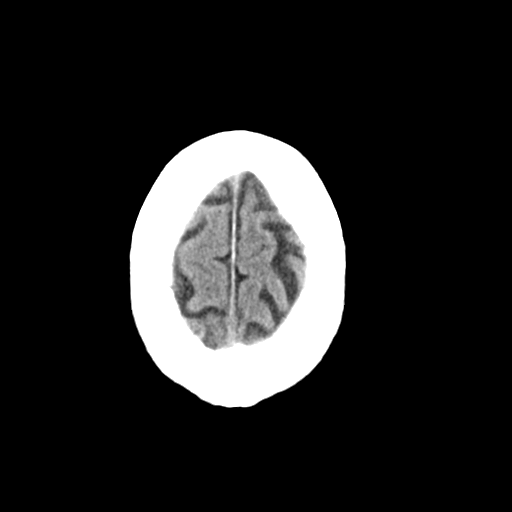
[im 31/38  bone]
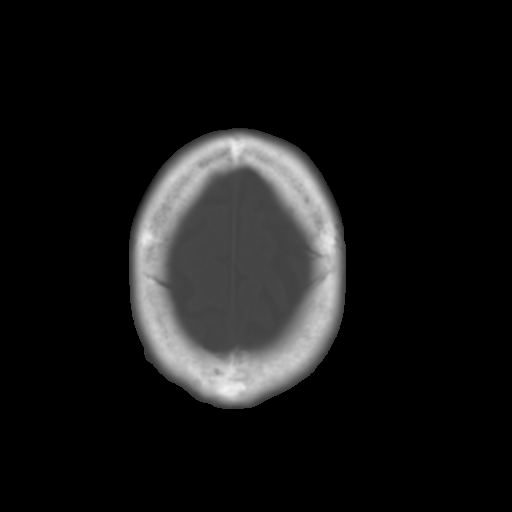
[im 35/38  brain]
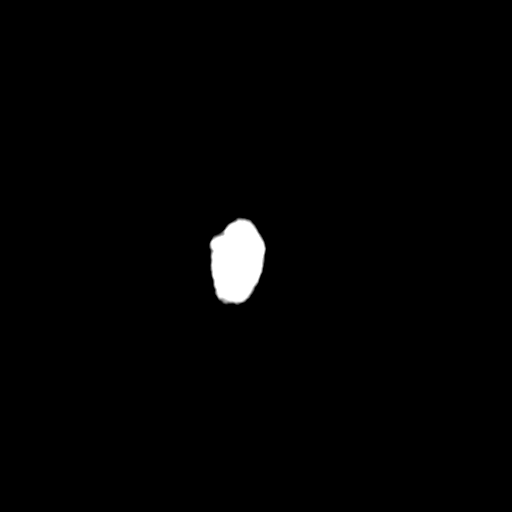

[Series 4: coronal soft · coronal · 0.37mm/px · 3 of 75 slices shown]
[im 25/75  brain]
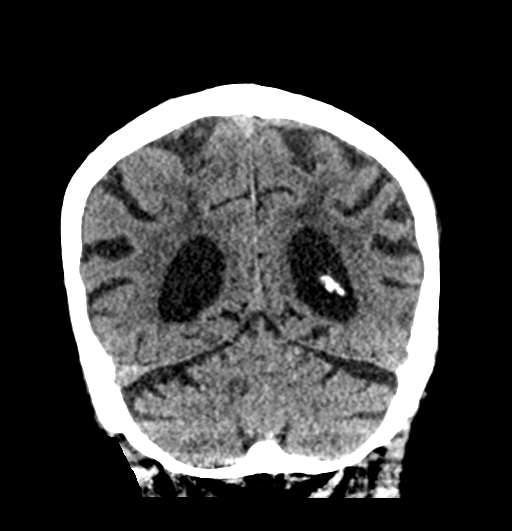
[im 33/75  brain]
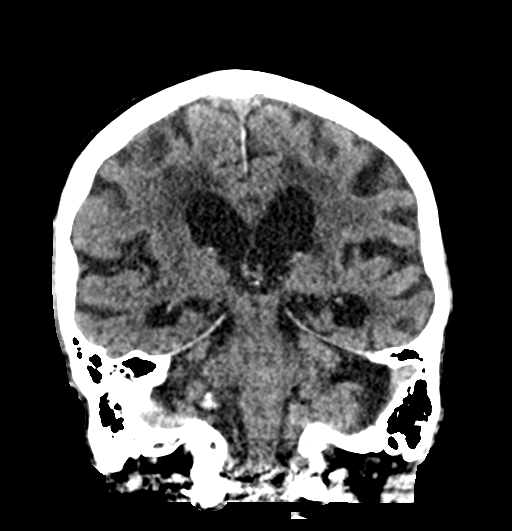
[im 42/75  brain]
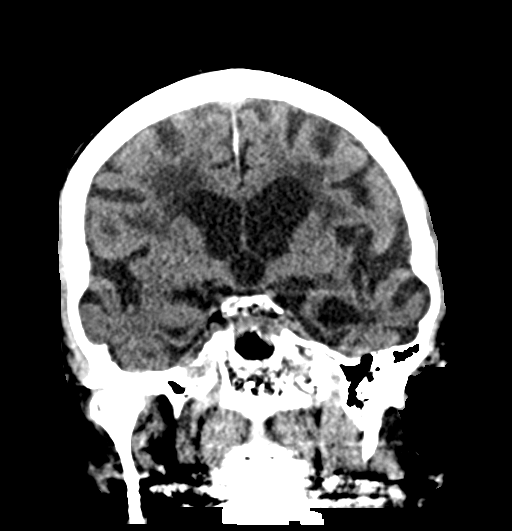

[Series 5: sagittal soft · sagittal · 0.39mm/px · 3 of 66 slices shown]
[im 22/66  brain]
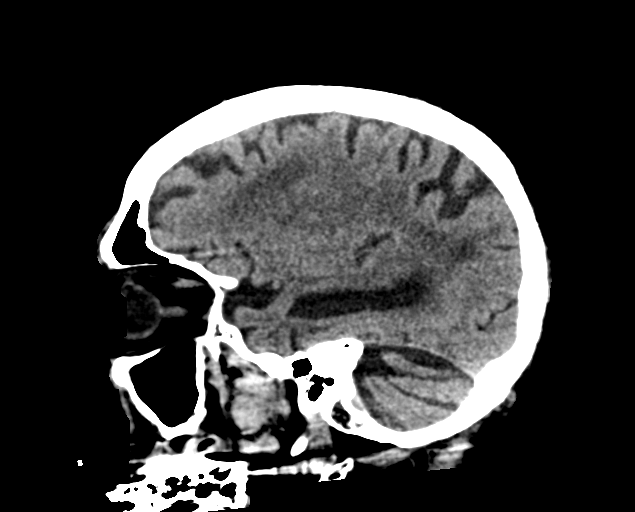
[im 33/66  brain]
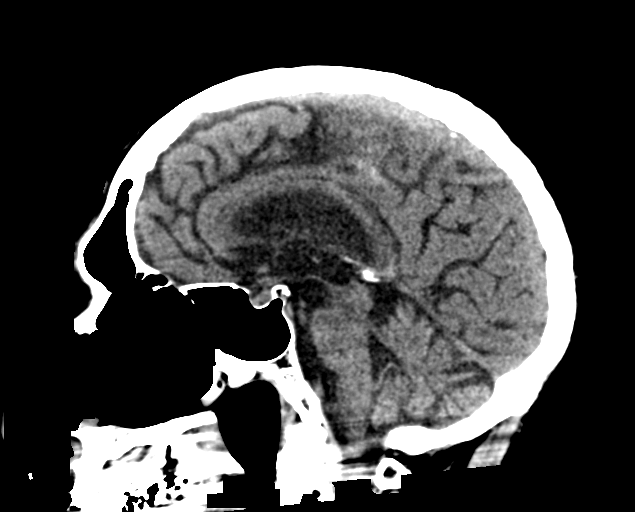
[im 44/66  brain]
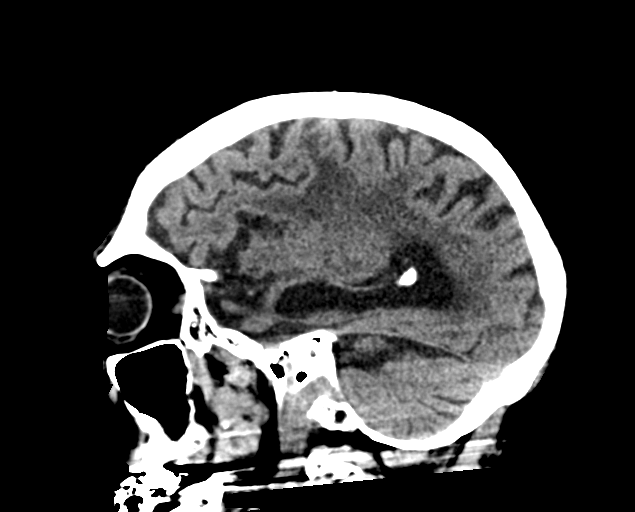

[16 of 47 positions shown; findings below may reference images not displayed]

FINDINGS: Brain: No intracranial hemorrhage, mass effect, or midline shift.
Generalized atrophy, progressed from 2900. Remote lacunar infarct in
the left basal ganglia and periventricular white matter. Advanced
chronic small vessel ischemia. No hydrocephalus. The basilar
cisterns are patent. No evidence of territorial infarct or acute
ischemia. No extra-axial or intracranial fluid collection.

Vascular: Atherosclerosis of skullbase vasculature without
hyperdense vessel or abnormal calcification.

Skull: No fracture or focal lesion.

Sinuses/Orbits: Mild mucosal thickening of right maxillary sinus
with tiny mucous retention cysts. No acute findings. Bilateral
cataract resection.

Other: None.
IMPRESSION: 1. No acute intracranial abnormality.
2. Generalized atrophy and advanced chronic small vessel ischemia.
Remote lacunar infarcts in the left basal ganglia and
periventricular white matter.
